# Patient Record
Sex: Female | Born: 1965 | Race: White | Hispanic: No | Smoking: Former smoker
Health system: Southern US, Community
[De-identification: ages and names within clinical notes are randomized; demographics above are authoritative.]

## PROBLEM LIST (undated history)

## (undated) DIAGNOSIS — Q232 Congenital mitral stenosis: Secondary | ICD-10-CM

## (undated) DIAGNOSIS — G35 Multiple sclerosis: Secondary | ICD-10-CM

## (undated) DIAGNOSIS — F419 Anxiety disorder, unspecified: Secondary | ICD-10-CM

## (undated) DIAGNOSIS — R42 Dizziness and giddiness: Secondary | ICD-10-CM

## (undated) DIAGNOSIS — R519 Headache, unspecified: Secondary | ICD-10-CM

## (undated) DIAGNOSIS — R2689 Other abnormalities of gait and mobility: Secondary | ICD-10-CM

## (undated) DIAGNOSIS — K589 Irritable bowel syndrome without diarrhea: Secondary | ICD-10-CM

## (undated) DIAGNOSIS — K5090 Crohn's disease, unspecified, without complications: Secondary | ICD-10-CM

## (undated) DIAGNOSIS — E11620 Type 2 diabetes mellitus with diabetic dermatitis: Secondary | ICD-10-CM

## (undated) DIAGNOSIS — K5792 Diverticulitis of intestine, part unspecified, without perforation or abscess without bleeding: Secondary | ICD-10-CM

## (undated) DIAGNOSIS — C801 Malignant (primary) neoplasm, unspecified: Secondary | ICD-10-CM

## (undated) DIAGNOSIS — H9313 Tinnitus, bilateral: Secondary | ICD-10-CM

## (undated) DIAGNOSIS — F1410 Cocaine abuse, uncomplicated: Secondary | ICD-10-CM

## (undated) DIAGNOSIS — K219 Gastro-esophageal reflux disease without esophagitis: Secondary | ICD-10-CM

## (undated) DIAGNOSIS — R51 Headache: Secondary | ICD-10-CM

## (undated) DIAGNOSIS — E7525 Metachromatic leukodystrophy: Secondary | ICD-10-CM

## (undated) HISTORY — PX: COLON SURGERY: SHX602

## (undated) HISTORY — PX: APPENDECTOMY: SHX54

## (undated) HISTORY — PX: KNEE ARTHROSCOPY: SUR90

## (undated) HISTORY — PX: ABDOMINAL HYSTERECTOMY: SHX81

---

## 1989-12-19 DIAGNOSIS — G35 Multiple sclerosis: Secondary | ICD-10-CM

## 1989-12-19 DIAGNOSIS — G35D Multiple sclerosis, unspecified: Secondary | ICD-10-CM

## 1989-12-19 HISTORY — DX: Multiple sclerosis, unspecified: G35.D

## 1989-12-19 HISTORY — DX: Multiple sclerosis: G35

## 2004-07-26 ENCOUNTER — Emergency Department (HOSPITAL_COMMUNITY): Admission: EM | Admit: 2004-07-26 | Discharge: 2004-07-26 | Payer: Self-pay | Admitting: Emergency Medicine

## 2004-08-29 ENCOUNTER — Inpatient Hospital Stay (HOSPITAL_COMMUNITY): Admission: EM | Admit: 2004-08-29 | Discharge: 2004-09-11 | Payer: Self-pay | Admitting: Emergency Medicine

## 2005-03-12 ENCOUNTER — Emergency Department (HOSPITAL_COMMUNITY): Admission: EM | Admit: 2005-03-12 | Discharge: 2005-03-12 | Payer: Self-pay | Admitting: Emergency Medicine

## 2005-09-19 IMAGING — CR DG CHEST 2V
2 series · 2 of 2 positions shown · non-contrast
Comparison: 09/02/04.

CLINICAL DATA: Chest pain and cough.  Smoking history.
 CHEST - 2 VIEWS:

[view not recorded (1 of 2)]
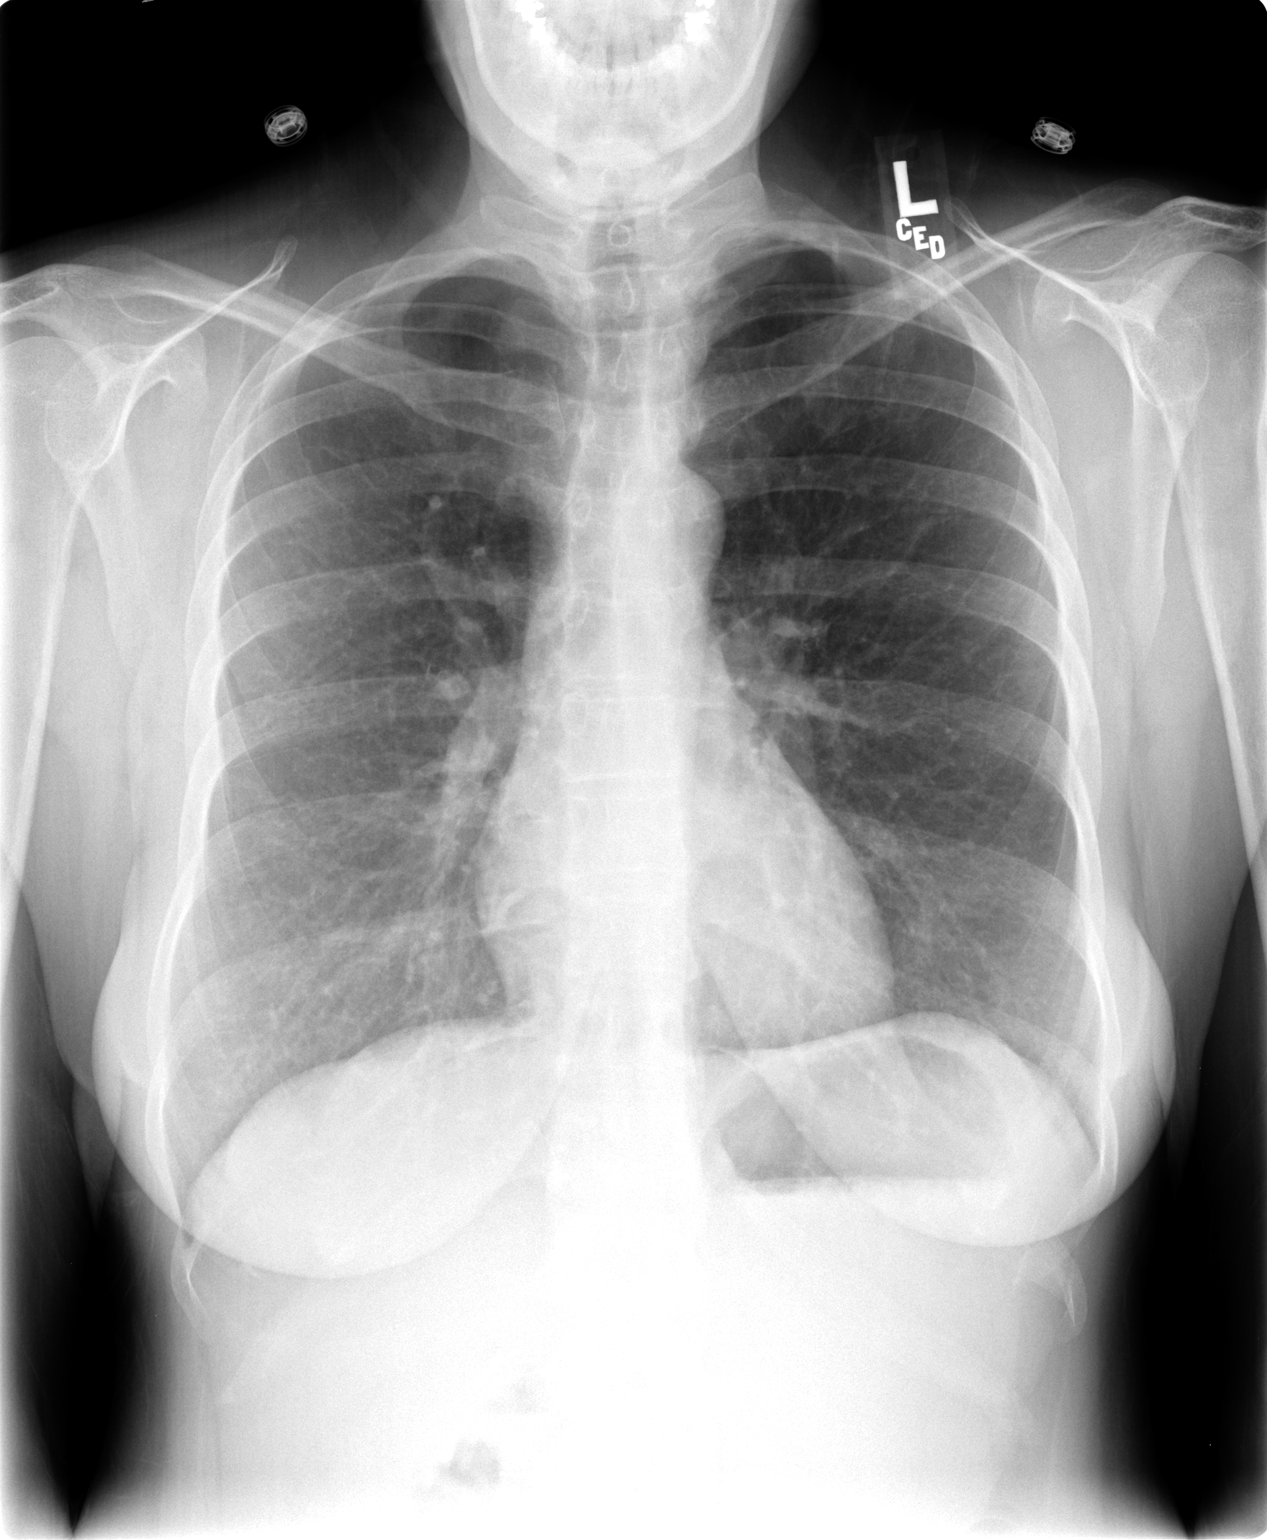

[view not recorded (2 of 2)]
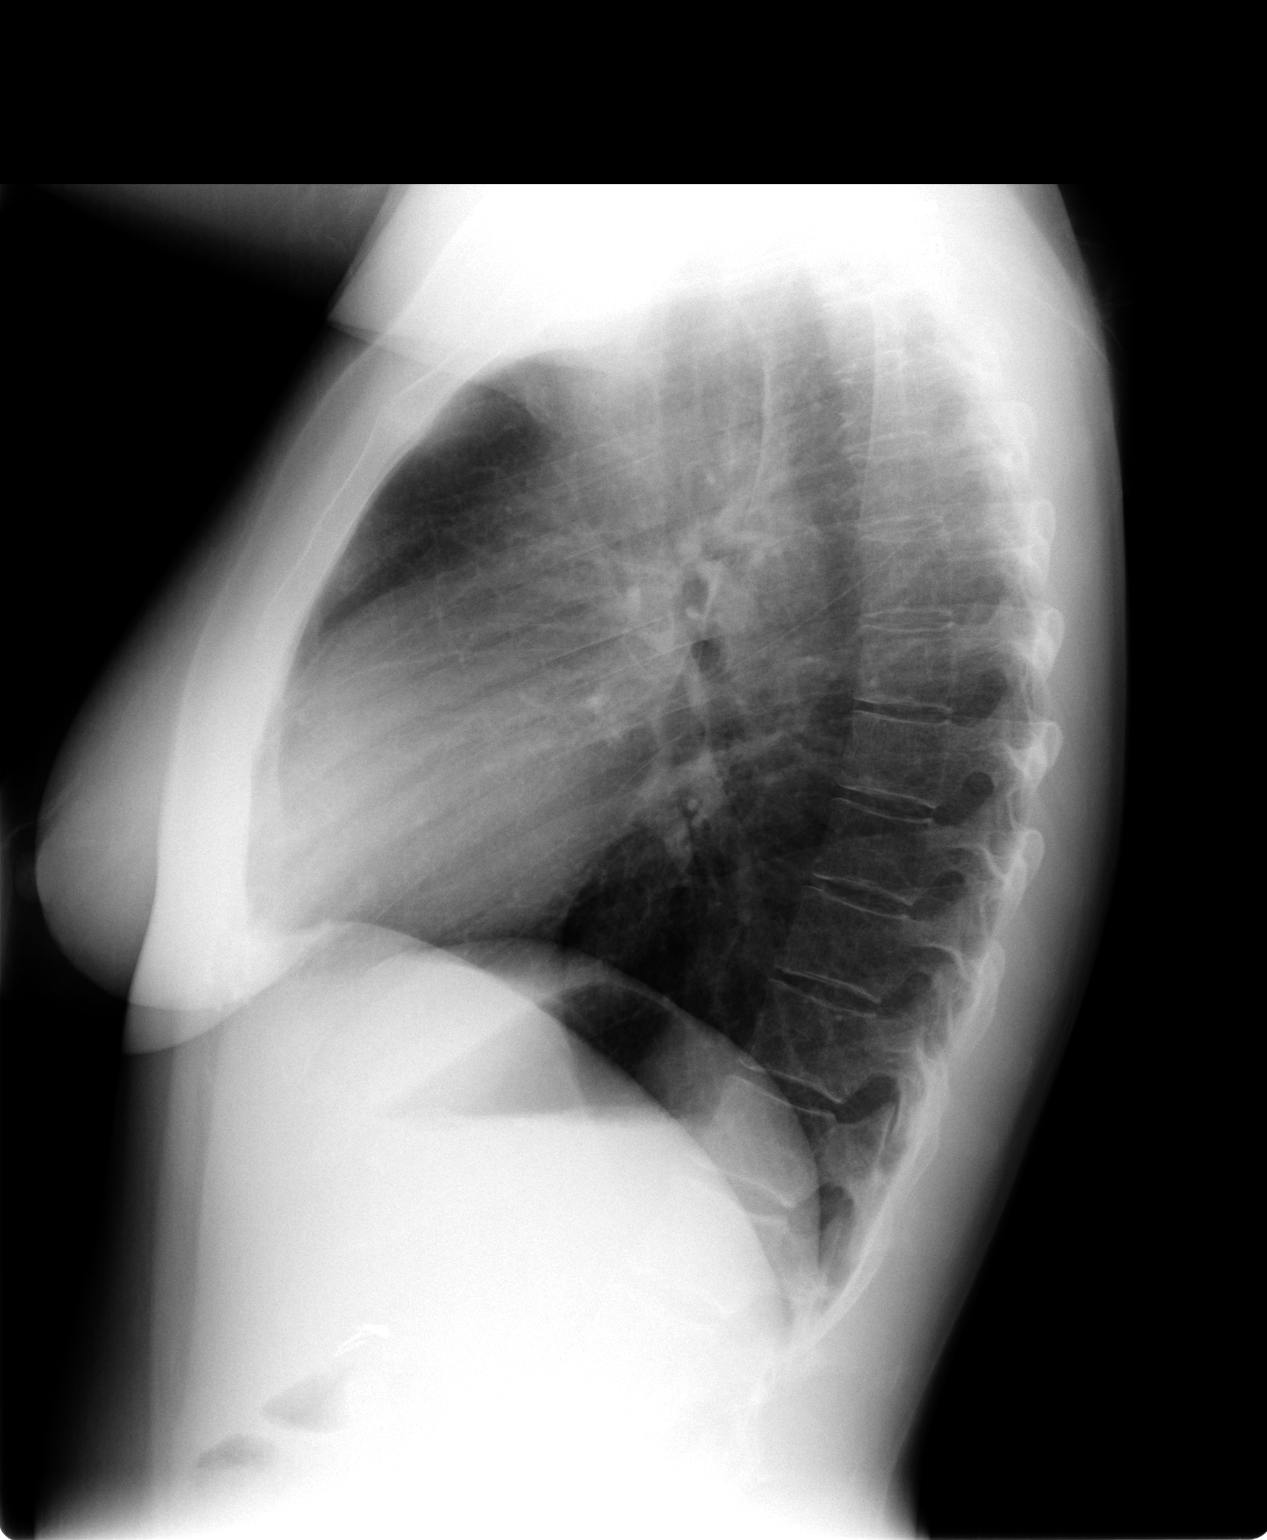

[2 of 2 positions shown; findings below may reference images not displayed]

The heart size is normal.   The mediastinum is unremarkable.  The lungs are clear.   No effusions.  No significant soft tissue or bony finding.
IMPRESSION: No active disease.

## 2018-01-06 ENCOUNTER — Emergency Department (HOSPITAL_COMMUNITY): Payer: Medicaid - Out of State

## 2018-01-06 ENCOUNTER — Other Ambulatory Visit: Payer: Self-pay

## 2018-01-06 ENCOUNTER — Emergency Department (HOSPITAL_COMMUNITY)
Admission: EM | Admit: 2018-01-06 | Discharge: 2018-01-06 | Disposition: A | Discharge status: Home / Self Care | Payer: Medicaid - Out of State | Attending: Emergency Medicine | Admitting: Emergency Medicine

## 2018-01-06 ENCOUNTER — Encounter (HOSPITAL_COMMUNITY): Payer: Self-pay | Admitting: Emergency Medicine

## 2018-01-06 DIAGNOSIS — Y999 Unspecified external cause status: Secondary | ICD-10-CM | POA: Diagnosis not present

## 2018-01-06 DIAGNOSIS — T1490XA Injury, unspecified, initial encounter: Secondary | ICD-10-CM

## 2018-01-06 DIAGNOSIS — W208XXA Other cause of strike by thrown, projected or falling object, initial encounter: Secondary | ICD-10-CM | POA: Insufficient documentation

## 2018-01-06 DIAGNOSIS — M25562 Pain in left knee: Secondary | ICD-10-CM | POA: Diagnosis not present

## 2018-01-06 DIAGNOSIS — F17210 Nicotine dependence, cigarettes, uncomplicated: Secondary | ICD-10-CM | POA: Insufficient documentation

## 2018-01-06 DIAGNOSIS — Z7982 Long term (current) use of aspirin: Secondary | ICD-10-CM | POA: Insufficient documentation

## 2018-01-06 DIAGNOSIS — Z91040 Latex allergy status: Secondary | ICD-10-CM | POA: Diagnosis not present

## 2018-01-06 DIAGNOSIS — S069X9A Unspecified intracranial injury with loss of consciousness of unspecified duration, initial encounter: Secondary | ICD-10-CM | POA: Insufficient documentation

## 2018-01-06 DIAGNOSIS — Y929 Unspecified place or not applicable: Secondary | ICD-10-CM | POA: Insufficient documentation

## 2018-01-06 DIAGNOSIS — S0990XA Unspecified injury of head, initial encounter: Secondary | ICD-10-CM | POA: Diagnosis present

## 2018-01-06 DIAGNOSIS — Y939 Activity, unspecified: Secondary | ICD-10-CM | POA: Insufficient documentation

## 2018-01-06 DIAGNOSIS — Z859 Personal history of malignant neoplasm, unspecified: Secondary | ICD-10-CM | POA: Insufficient documentation

## 2018-01-06 HISTORY — DX: Malignant (primary) neoplasm, unspecified: C80.1

## 2018-01-06 HISTORY — DX: Congenital mitral stenosis: Q23.2

## 2018-01-06 HISTORY — DX: Multiple sclerosis: G35

## 2018-01-06 HISTORY — DX: Diverticulitis of intestine, part unspecified, without perforation or abscess without bleeding: K57.92

## 2018-01-06 HISTORY — DX: Irritable bowel syndrome without diarrhea: K58.9

## 2018-01-06 HISTORY — DX: Irritable bowel syndrome, unspecified: K58.9

## 2018-01-06 HISTORY — DX: Metachromatic leukodystrophy: E75.25

## 2018-01-06 MED ORDER — KETOROLAC TROMETHAMINE 30 MG/ML IJ SOLN
30.0000 mg | Freq: Once | INTRAMUSCULAR | Status: AC
  Administered 2018-01-06: 30 mg via INTRAVENOUS
  Filled 2018-01-06: qty 1

## 2018-01-06 MED ORDER — PROMETHAZINE HCL 25 MG/ML IJ SOLN
25.0000 mg | Freq: Once | INTRAMUSCULAR | Status: AC
  Administered 2018-01-06: 25 mg via INTRAVENOUS
  Filled 2018-01-06: qty 1

## 2018-01-06 MED ORDER — IBUPROFEN 600 MG PO TABS: 600.0000 mg | ORAL_TABLET | Freq: Four times a day (QID) | ORAL | 0 refills | Status: AC | PRN

## 2018-01-06 MED ORDER — HYDROMORPHONE HCL 1 MG/ML IJ SOLN
0.5000 mg | Freq: Once | INTRAMUSCULAR | Status: AC
  Administered 2018-01-06: 0.5 mg via INTRAVENOUS
  Filled 2018-01-06: qty 1

## 2018-01-06 MED ORDER — OXYCODONE-ACETAMINOPHEN 5-325 MG PO TABS: 1.0000 | ORAL_TABLET | ORAL | 0 refills | Status: AC | PRN

## 2018-01-06 MED ORDER — ONDANSETRON HCL 4 MG/2ML IJ SOLN: 4.0000 mg | Freq: Once | INTRAMUSCULAR | Status: DC

## 2018-01-06 NOTE — ED Notes (Signed)
JI in to assess 

## 2018-01-06 NOTE — ED Notes (Signed)
To Rad 

## 2018-01-06 NOTE — ED Notes (Signed)
To radiology

## 2018-01-06 NOTE — Discharge Instructions (Signed)
Ice and elevate your knee as much as possible over the next several days. You may take the oxycodone prescribed for pain relief.  This will make you drowsy - do not drive within 4 hours of taking this medication and use sparingly.  Your xrays and CT scans are negative for acute bony injury or internal brain injury from todays event.  Refer to the head injury instructions and plan to see your doctor for a recheck in 1 week if your headache is not resolved.

## 2018-01-06 NOTE — ED Notes (Signed)
Awaiting eval  

## 2018-01-06 NOTE — ED Notes (Signed)
From rad 

## 2018-01-06 NOTE — ED Notes (Signed)
Call to Colome PA to request that she reassess pt and speak to her regarding her report if itching

## 2018-01-06 NOTE — ED Notes (Signed)
JI in to re assess 

## 2018-01-06 NOTE — ED Notes (Signed)
Pt on call light to complain that her pain meds are  Not working and that she is itching   Awaiting PA to reassess

## 2018-01-06 NOTE — ED Provider Notes (Signed)
[facility] EMERGENCY DEPARTMENT Provider Note   CSN: [id number] Arrival date & time: 01/06/18  1053     History   Chief Complaint Chief Complaint  Patient presents with  . Knee Pain    L knee    HPI Robin Freeman is a 52 y.o. female presenting with injury sustained when a large tree branch fell out of a tree hitting her on her head.  She states that she fell to the ground hitting her left knee which she states is unstable at baseline given multiple surgeries and complete loss of cartilage.  Additionally she had a brief LOC with the fall.  Her daughter-in-law who witnessed the event states she was unconscious for less than 60 seconds.  She denies any focal weakness, numbness, neck or back pain, confusion, but does endorse persistent headache.  She had 2 episodes of vomiting after she woke, and continues to be nauseated but no persistent emesis.  She was treated with fentanyl per EMS prior to arrival which she states did not improve her pain.   The history is provided by the patient.    Past Medical History:  Diagnosis Date  . Cancer ([place])   . Diverticulitis   . IBS (irritable bowel syndrome)   . MLD (metachromatic leukodystrophy)   . MS (congenital mitral stenosis)   . Multiple sclerosis ([place])     There are no active problems to display for this patient.   Past Surgical History:  Procedure Laterality Date  . ABDOMINAL HYSTERECTOMY    . APPENDECTOMY    . COLON SURGERY    . KNEE ARTHROSCOPY      OB History    No data available       Home Medications    Prior to Admission medications   Medication Sig Start Date End Date Taking? Authorizing Provider  aspirin EC 81 MG tablet Take 81 mg by mouth daily.   Yes [provider]  hydrOXYzine (ATARAX/VISTARIL) 25 MG tablet Take 25 mg by mouth 3 (three) times daily as needed.   Yes [provider]  Multiple Vitamin (MULTIVITAMIN) tablet Take 1 tablet by mouth daily.   Yes [provider]    omeprazole (PRILOSEC) 20 MG capsule Take 20 mg by mouth as needed.   Yes [provider]  promethazine (PHENERGAN) 25 MG tablet Take 25 mg by mouth as needed for nausea or vomiting.   Yes [provider]  ibuprofen (ADVIL,MOTRIN) 600 MG tablet Take 1 tablet (600 mg total) by mouth every 6 (six) hours as needed. 01/06/18   Evalee Jefferson, PA-C  oxyCODONE-acetaminophen (PERCOCET/ROXICET) 5-325 MG tablet Take 1 tablet by mouth every 4 (four) hours as needed. 01/06/18   Evalee Jefferson, PA-C    Family History No family history on file.  Social History Social History   Tobacco Use  . Smoking status: Current Every Day Smoker    Packs/day: 1.00    Types: Cigarettes  . Smokeless tobacco: Never Used  Substance Use Topics  . Alcohol use: No    Frequency: Never  . Drug use: Yes    Types: Marijuana     Allergies   Cephalosporins; Hydrocodone; Keppra [levetiracetam]; Lasix [furosemide]; Latex; Morphine and related; and Sulfa antibiotics   Review of Systems Review of Systems  Constitutional: Negative for fever.  Eyes: Negative for visual disturbance.  Gastrointestinal: Positive for nausea and vomiting. Negative for abdominal pain.  Musculoskeletal: Positive for arthralgias. Negative for back pain, joint swelling, myalgias and neck pain.  Skin: Negative.   Neurological: Positive for syncope and headaches. Negative for weakness and numbness.     Physical Exam Updated Vital Signs BP (!) 113/93   Pulse 67   Temp 98 F (36.7 C) (Oral)   Resp 16   Ht 5\' 2"  (1.575 m)   Wt 64.9 kg (143 lb)   SpO2 99%   BMI 26.16 kg/m   Physical Exam  Constitutional: She is oriented to person, place, and time. She appears well-developed and well-nourished.  HENT:  Head: Atraumatic.  Eyes: EOM are normal. Pupils are equal, round, and reactive to light.  Neck: Normal range of motion.  Cardiovascular: Normal rate.  Pulses:      Dorsalis pedis pulses are 2+ on the right side, and 2+ on  the left side.  Pulses equal bilaterally  Pulmonary/Chest: Effort normal and breath sounds normal.  Abdominal: Soft. There is no tenderness. There is no guarding.  Musculoskeletal: She exhibits tenderness.       Left knee: She exhibits no swelling and no effusion. Tenderness found. Lateral joint line tenderness noted.  Well-healed surgical incisions noted at left knee.  Neurological: She is alert and oriented to person, place, and time. She has normal strength. She displays normal reflexes. No cranial nerve deficit or sensory deficit.  Equal grip strength  Skin: Skin is warm and dry.  Skin intact without trauma.  She has some tenderness to palpation on her superior scalp, there is no hematoma or abrasions noted.  Psychiatric: She has a normal mood and affect.     ED Treatments / Results  Labs (all labs ordered are listed, but only abnormal results are displayed) Labs Reviewed - No data to display  EKG  EKG Interpretation None       Radiology Ct Head Wo Contrast  Result Date: 01/06/2018 CLINICAL DATA:  Post blunt trauma to the head with positive loss of consciousness. History of multiple sclerosis. EXAM: CT HEAD WITHOUT CONTRAST CT CERVICAL SPINE WITHOUT CONTRAST TECHNIQUE: Multidetector CT imaging of the head and cervical spine was performed following the standard protocol without intravenous contrast. Multiplanar CT image reconstructions of the cervical spine were also generated. COMPARISON:  Brain MRI 08/31/2004 FINDINGS: CT HEAD FINDINGS Brain: No evidence of acute infarction, hemorrhage, hydrocephalus, extra-axial collection or mass lesion/mass effect. Vascular: No hyperdense vessel or unexpected calcification. Skull: Normal. Negative for fracture or focal lesion. Sinuses/Orbits: No acute finding. Other: None. CT CERVICAL SPINE FINDINGS Alignment: Reversal of cervical lordosis. Skull base and vertebrae: No acute fracture. No primary bone lesion or focal pathologic process. Soft  tissues and spinal canal: No prevertebral fluid or swelling. No visible canal hematoma. Disc levels:  Mild multilevel osteoarthritic changes. Upper chest: Negative. Other: None. IMPRESSION: No acute intracranial abnormality. No evidence of acute traumatic injury to the cervical spine. Reversal of cervical lordosis, likely positional. Electronically Signed   By: Fidela Salisbury M.D.   On: 01/06/2018 12:55   Ct Cervical Spine Wo Contrast  Result Date: 01/06/2018 CLINICAL DATA:  Post blunt trauma to the head with positive loss of consciousness. History of multiple sclerosis. EXAM: CT HEAD WITHOUT CONTRAST CT CERVICAL SPINE WITHOUT CONTRAST TECHNIQUE: Multidetector CT imaging of the head and cervical spine was performed following the standard protocol without intravenous contrast. Multiplanar CT image reconstructions of the cervical spine were also generated. COMPARISON:  Brain MRI 08/31/2004 FINDINGS: CT HEAD FINDINGS Brain: No evidence of acute infarction, hemorrhage, hydrocephalus, extra-axial collection or mass lesion/mass effect. Vascular: No hyperdense vessel or unexpected calcification.  Skull: Normal. Negative for fracture or focal lesion. Sinuses/Orbits: No acute finding. Other: None. CT CERVICAL SPINE FINDINGS Alignment: Reversal of cervical lordosis. Skull base and vertebrae: No acute fracture. No primary bone lesion or focal pathologic process. Soft tissues and spinal canal: No prevertebral fluid or swelling. No visible canal hematoma. Disc levels:  Mild multilevel osteoarthritic changes. Upper chest: Negative. Other: None. IMPRESSION: No acute intracranial abnormality. No evidence of acute traumatic injury to the cervical spine. Reversal of cervical lordosis, likely positional. Electronically Signed   By: Fidela Salisbury M.D.   On: 01/06/2018 12:55   Dg Knee Complete 4 Views Left  Result Date: 01/06/2018 CLINICAL DATA:  Pain following fall EXAM: LEFT KNEE - COMPLETE 4+ VIEW COMPARISON:  None.  FINDINGS: Frontal, lateral, and bilateral oblique views were obtained. There is postoperative change with evidence of cruciate ligament fixation. No acute fracture or dislocation. No joint effusion. There is mild joint space narrowing medially with spurring in this area. Other joint spaces appear unremarkable. No erosive change. IMPRESSION: Narrowing and spurring medially. Other joint spaces appear normal. No fracture or joint effusion. Postoperative change noted. Electronically Signed   By: Lowella Grip III M.D.   On: 01/06/2018 11:42    Procedures Procedures (including critical care time)  Medications Ordered in ED Medications  HYDROmorphone (DILAUDID) injection 0.5 mg (0.5 mg Intravenous Given 01/06/18 1204)  promethazine (PHENERGAN) injection 25 mg (25 mg Intravenous Given 01/06/18 1201)  ketorolac (TORADOL) 30 MG/ML injection 30 mg (30 mg Intravenous Given 01/06/18 1325)     Initial Impression / Assessment and Plan / ED Course  I have reviewed the triage vital signs and the nursing notes.  Pertinent labs & imaging results that were available during my care of the patient were reviewed by me and considered in my medical decision making (see chart for details).     Pt with acute on chronic left knee pain without obvious bony deformity on today's imaging.  She endorses that she has chronically has no support in the left knee and it frequently dislocates if she is not careful with ambulation.  She has been advised by her orthopedist in [place] that she needs a total knee replacement which she has been avoiding.  She has persistent headache at time of discharge but improving.  She has had no vomiting while in the department.  CT imaging reviewed and negative for acute intracranial trauma.  She was given minor head injury instructions and advised follow-up with her PCP for recheck in 1 week if her symptoms persist.  She was also advised to follow-up with her orthopedist as needed for ongoing  management of her chronic left knee pain.  She was placed in a knee immobilizer to stabilize the knee.  She was prescribed ibuprofen and oxycodone for pain relief.  The [place] and [place] narcotic database was reviewed prior to disposition.  Final Clinical Impressions(s) / ED Diagnoses   Final diagnoses:  Acute pain of left knee  Minor head injury with loss of consciousness, initial encounter [facility])    ED Discharge Orders        Ordered    oxyCODONE-acetaminophen (PERCOCET/ROXICET) 5-325 MG tablet  Every 4 hours PRN     01/06/18 1322    ibuprofen (ADVIL,MOTRIN) 600 MG tablet  Every 6 hours PRN     01/06/18 1322       Evalee Jefferson, PA-C 01/06/18 1432    Mesner, Corene Cornea, MD 01/06/18 1520

## 2018-01-06 NOTE — ED Notes (Signed)
Pt reports she has pulled off her leads etc due to her allergy to latex  CN in to educated pt that there is no latex on anything touching pt

## 2018-01-06 NOTE — ED Notes (Addendum)
Family in room to visit- pt continues to be alert

## 2018-01-06 NOTE — ED Triage Notes (Signed)
Walking dog Branch fell off of tree and hit in head  LOC Also complains of  L knee pain-chronic per pt and EMS report  PCP in [employer]

## 2018-01-06 NOTE — ED Notes (Signed)
PT HAS RECEIVED FENT 100 MCG ENROUTE BY EMS  REPORTS PAIN 10/10 IN SPITE OF RELAXED FACIAL FEATURES, FROM EXCEPT L KNEE

## 2018-12-02 ENCOUNTER — Emergency Department (HOSPITAL_COMMUNITY): Payer: Medicaid - Out of State

## 2018-12-02 ENCOUNTER — Other Ambulatory Visit: Payer: Self-pay

## 2018-12-02 ENCOUNTER — Emergency Department (HOSPITAL_COMMUNITY)
Admission: EM | Admit: 2018-12-02 | Discharge: 2018-12-02 | Disposition: A | Discharge status: Home / Self Care | Payer: Medicaid - Out of State | Attending: Emergency Medicine | Admitting: Emergency Medicine

## 2018-12-02 ENCOUNTER — Encounter (HOSPITAL_COMMUNITY): Payer: Self-pay

## 2018-12-02 DIAGNOSIS — Z79899 Other long term (current) drug therapy: Secondary | ICD-10-CM | POA: Insufficient documentation

## 2018-12-02 DIAGNOSIS — Z7982 Long term (current) use of aspirin: Secondary | ICD-10-CM | POA: Insufficient documentation

## 2018-12-02 DIAGNOSIS — R112 Nausea with vomiting, unspecified: Secondary | ICD-10-CM | POA: Diagnosis not present

## 2018-12-02 DIAGNOSIS — R1084 Generalized abdominal pain: Secondary | ICD-10-CM | POA: Insufficient documentation

## 2018-12-02 DIAGNOSIS — F17210 Nicotine dependence, cigarettes, uncomplicated: Secondary | ICD-10-CM | POA: Insufficient documentation

## 2018-12-02 HISTORY — DX: Other abnormalities of gait and mobility: R26.89

## 2018-12-02 HISTORY — DX: Anxiety disorder, unspecified: F41.9

## 2018-12-02 HISTORY — DX: Gastro-esophageal reflux disease without esophagitis: K21.9

## 2018-12-02 HISTORY — DX: Dizziness and giddiness: R42

## 2018-12-02 HISTORY — DX: Crohn's disease, unspecified, without complications: K50.90

## 2018-12-02 HISTORY — DX: Cocaine abuse, uncomplicated: F14.10

## 2018-12-02 HISTORY — DX: Headache, unspecified: R51.9

## 2018-12-02 HISTORY — DX: Tinnitus, bilateral: H93.13

## 2018-12-02 HISTORY — DX: Type 2 diabetes mellitus with diabetic dermatitis: E11.620

## 2018-12-02 HISTORY — DX: Headache: R51

## 2018-12-02 LAB — URINALYSIS, ROUTINE W REFLEX MICROSCOPIC
Bilirubin Urine: NEGATIVE
Glucose, UA: NEGATIVE mg/dL
Hgb urine dipstick: NEGATIVE
Ketones, ur: NEGATIVE mg/dL
Leukocytes, UA: NEGATIVE
Nitrite: NEGATIVE
Protein, ur: NEGATIVE mg/dL
Specific Gravity, Urine: >1.046 — ABNORMAL HIGH (ref 1.005–1.030)
pH: 7 (ref 5.0–8.0)

## 2018-12-02 LAB — COMPREHENSIVE METABOLIC PANEL
ALT: 18 U/L (ref 0–44)
AST: 18 U/L (ref 15–41)
Albumin: 3.7 g/dL (ref 3.5–5.0)
Alkaline Phosphatase: 96 U/L (ref 38–126)
Anion gap: 6 (ref 5–15)
BILIRUBIN TOTAL: 0.7 mg/dL (ref 0.3–1.2)
BUN: 17 mg/dL (ref 6–20)
CO2: 23 mmol/L (ref 22–32)
Calcium: 8.8 mg/dL — ABNORMAL LOW (ref 8.9–10.3)
Chloride: 107 mmol/L (ref 98–111)
Creatinine, Ser: 0.6 mg/dL (ref 0.44–1.00)
GFR calc Af Amer: >60 mL/min (ref >60)
GFR calc non Af Amer: >60 mL/min (ref >60)
Glucose, Bld: 89 mg/dL (ref 70–99)
Potassium: 4 mmol/L (ref 3.5–5.1)
Sodium: 136 mmol/L (ref 135–145)
TOTAL PROTEIN: 6.9 g/dL (ref 6.5–8.1)

## 2018-12-02 LAB — RAPID URINE DRUG SCREEN, HOSP PERFORMED
AMPHETAMINES: NOT DETECTED
Barbiturates: NOT DETECTED
Benzodiazepines: NOT DETECTED
Cocaine: NOT DETECTED
Opiates: NOT DETECTED
Tetrahydrocannabinol: POSITIVE — AB

## 2018-12-02 LAB — CBC
HCT: 42 % (ref 36.0–46.0)
Hemoglobin: 13.7 g/dL (ref 12.0–15.0)
MCH: 30.4 pg (ref 26.0–34.0)
MCHC: 32.6 g/dL (ref 30.0–36.0)
MCV: 93.3 fL (ref 80.0–100.0)
Platelets: 232 10*3/uL (ref 150–400)
RBC: 4.5 MIL/uL (ref 3.87–5.11)
RDW: 13 % (ref 11.5–15.5)
WBC: 10.4 10*3/uL (ref 4.0–10.5)
nRBC: 0 % (ref 0.0–0.2)

## 2018-12-02 LAB — TROPONIN I: Troponin I: <0.03 ng/mL (ref <0.03)

## 2018-12-02 LAB — LIPASE, BLOOD: Lipase: 35 U/L (ref 11–51)

## 2018-12-02 MED ORDER — DICYCLOMINE HCL 10 MG/ML IM SOLN
20.0000 mg | Freq: Once | INTRAMUSCULAR | Status: AC
  Administered 2018-12-02: 20 mg via INTRAMUSCULAR
  Filled 2018-12-02: qty 2

## 2018-12-02 MED ORDER — DICYCLOMINE HCL 20 MG PO TABS: 20.0000 mg | ORAL_TABLET | Freq: Four times a day (QID) | ORAL | 0 refills | Status: AC | PRN

## 2018-12-02 MED ORDER — METOCLOPRAMIDE HCL 5 MG/ML IJ SOLN
10.0000 mg | Freq: Once | INTRAMUSCULAR | Status: AC
  Administered 2018-12-02: 10 mg via INTRAVENOUS
  Filled 2018-12-02: qty 2

## 2018-12-02 MED ORDER — FAMOTIDINE IN NACL 20-0.9 MG/50ML-% IV SOLN
20.0000 mg | Freq: Once | INTRAVENOUS | Status: AC
  Administered 2018-12-02: 20 mg via INTRAVENOUS
  Filled 2018-12-02: qty 50

## 2018-12-02 MED ORDER — SODIUM CHLORIDE 0.9 % IV BOLUS
1000.0000 mL | Freq: Once | INTRAVENOUS | Status: AC
  Administered 2018-12-02: 1000 mL via INTRAVENOUS

## 2018-12-02 MED ORDER — DIPHENHYDRAMINE HCL 50 MG/ML IJ SOLN
50.0000 mg | Freq: Once | INTRAMUSCULAR | Status: AC
  Administered 2018-12-02: 50 mg via INTRAVENOUS
  Filled 2018-12-02: qty 1

## 2018-12-02 MED ORDER — IOPAMIDOL (ISOVUE-300) INJECTION 61%
100.0000 mL | Freq: Once | INTRAVENOUS | Status: AC | PRN
  Administered 2018-12-02: 100 mL via INTRAVENOUS

## 2018-12-02 MED ORDER — METOCLOPRAMIDE HCL 10 MG PO TABS: 10.0000 mg | ORAL_TABLET | Freq: Four times a day (QID) | ORAL | 0 refills | Status: AC | PRN

## 2018-12-02 NOTE — ED Notes (Signed)
Pt given crackers and gingerale.

## 2018-12-02 NOTE — ED Provider Notes (Signed)
[facility] EMERGENCY DEPARTMENT Provider Note   CSN: [id number] Arrival date & time: 12/02/18  1135     History   Chief Complaint Chief Complaint  Patient presents with  . Emesis    HPI Robin Freeman is a 52 y.o. female.  HPI  Pt was seen at 1315.  Per pt, c/o gradual onset and persistence of multiple intermittent episodes of N/V for the past 2 weeks.   Describes her stools per her usual "chronic diarrhea;" denies any change. Has been associated with generalized abd "cramping" and "fullness," especially after she eats. Pt states she has been taking zofran and phenergan without improvement.  Denies CP/SOB, no back pain, no fevers, no black or blood in stools or emesis.    Past Medical History:  Diagnosis Date  . Anxiety   . Cancer ([place])   . Cocaine abuse ([place])   . Crohn disease ([place])   . Diverticulitis   . GERD (gastroesophageal reflux disease)   . Headache disorder   . IBS (irritable bowel syndrome)   . Imbalance    chronic  . 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 MS (congenital mitral stenosis)   . Multiple sclerosis ([place]) 1991  . 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 Vertigo     There are no active problems to display for this patient.   Past Surgical History:  Procedure Laterality Date  . ABDOMINAL HYSTERECTOMY    . APPENDECTOMY    . COLON SURGERY    . KNEE ARTHROSCOPY       OB History   No obstetric history on file.      Home Medications    Prior to Admission medications   Medication Sig Start Date End Date Taking? Authorizing Provider  aspirin EC 81 MG tablet Take 81 mg by mouth daily.   Yes [provider]  hydrOXYzine (ATARAX/VISTARIL) 25 MG tablet Take 25 mg by mouth 3 (three) times daily as needed.   Yes [provider]  ibuprofen (ADVIL,MOTRIN) 600 MG tablet Take 1 tablet (600 mg total) by mouth every 6 (six) hours as needed. 01/06/18   Yes Idol, Almyra Free, PA-C  meclizine (ANTIVERT) 25 MG tablet Take 25 mg by mouth 3 (three) times daily as needed for dizziness.   Yes [provider]  Multiple Vitamin (MULTIVITAMIN) tablet Take 1 tablet by mouth daily.   Yes [provider]  oxyCODONE-acetaminophen (PERCOCET/ROXICET) 5-325 MG tablet Take 1 tablet by mouth every 4 (four) hours as needed. 01/06/18  Yes Idol, Almyra Free, PA-C  pantoprazole (PROTONIX) 20 MG tablet Take by mouth. 10/11/18  Yes [provider]  promethazine (PHENERGAN) 25 MG tablet Take 25 mg by mouth as needed for nausea or vomiting.   Yes [provider]  omeprazole (PRILOSEC) 20 MG capsule Take 20 mg by mouth as needed.    [provider]    Family History No family history on file.  Social History Social History   Tobacco Use  . Smoking status: Current Every Day Smoker    Packs/day: 1.00    Types: Cigarettes  . Smokeless tobacco: Never Used  Substance Use Topics  . Alcohol use: No    Frequency: Never  . Drug use: Yes    Types: Marijuana     Allergies   Cephalosporins; Doxycycline; Hydrocodone; Keppra [levetiracetam]; Lasix [furosemide]; Latex; Morphine and related; and Sulfa antibiotics   Review of  Systems Review of Systems ROS: Statement: All systems negative except as marked or noted in the HPI; Constitutional: Negative for fever and chills. ; ; Eyes: Negative for eye pain, redness and discharge. ; ; ENMT: Negative for ear pain, hoarseness, nasal congestion, sinus pressure and sore throat. ; ; Cardiovascular: Negative for chest pain, palpitations, diaphoresis, dyspnea and peripheral edema. ; ; Respiratory: Negative for cough, wheezing and stridor. ; ; Gastrointestinal: +N/V, abd pain. Negative for diarrhea, blood in stool, hematemesis, jaundice and rectal bleeding. . ; ; Genitourinary: Negative for dysuria, flank pain and hematuria. ; ; Musculoskeletal: Negative for back pain and neck pain. Negative for swelling  and trauma.; ; Skin: Negative for pruritus, rash, abrasions, blisters, bruising and skin lesion.; ; Neuro: Negative for headache, lightheadedness and neck stiffness. Negative for weakness, altered level of consciousness, altered mental status, extremity weakness, paresthesias, involuntary movement, seizure and syncope.       Physical Exam Updated Vital Signs BP (!) 147/91 (BP Location: Right Arm)   Pulse 87   Temp 98.2 F (36.8 C) (Oral)   Resp 15   Ht 5' 1"  (1.549 m)   Wt 64.9 kg   SpO2 98%   BMI 27.02 kg/m    BP 112/77 (BP Location: Right Arm)   Pulse 76   Temp 98.2 F (36.8 C) (Oral)   Resp 19   Ht 5' 1"  (1.549 m)   Wt 64.9 kg   SpO2 99%   BMI 27.02 kg/m    Physical Exam 1320: Physical examination:  Nursing notes reviewed; Vital signs and O2 SAT reviewed;  Constitutional: Well developed, Well nourished, Well hydrated, In no acute distress; Head:  Normocephalic, atraumatic; Eyes: EOMI, PERRL, No scleral icterus; ENMT: TM's clear bilat. Mouth and pharynx normal, Mucous membranes moist; Neck: Supple, Full range of motion, No lymphadenopathy; Cardiovascular: Regular rate and rhythm, No gallop; Respiratory: Breath sounds clear & equal bilaterally, No wheezes.  Speaking full sentences with ease, Normal respiratory effort/excursion; Chest: Nontender, Movement normal; Abdomen: Soft, +mild generalized tenderness to palp. No rebound or guarding. Nondistended, Normal bowel sounds; Genitourinary: No CVA tenderness; Extremities: Peripheral pulses normal, No tenderness, No edema, No calf edema or asymmetry.; Neuro: AA&Ox3, Major CN grossly intact.  Speech clear. No gross focal motor or sensory deficits in extremities.; Skin: Color normal, Warm, Dry.   ED Treatments / Results  Labs (all labs ordered are listed, but only abnormal results are displayed)   EKG EKG Interpretation  Date/Time:  Sunday December 02 2018 14:53:04 EST Ventricular Rate:  74 PR Interval:    QRS Duration: 90 QT  Interval:  378 QTC Calculation: 420 R Axis:   83 Text Interpretation:  Sinus rhythm Borderline repolarization abnormality Baseline wander When compared with ECG of 08/28/2004 No significant change was found Confirmed by Francine Graven [phone]) on 12/02/2018 3:04:35 PM   Radiology   Procedures Procedures (including critical care time)  Medications Ordered in ED Medications  sodium chloride 0.9 % bolus 1,000 mL (has no administration in time range)  metoCLOPramide (REGLAN) injection 10 mg (has no administration in time range)  famotidine (PEPCID) IVPB 20 mg premix (has no administration in time range)     Initial Impression / Assessment and Plan / ED Course  I have reviewed the triage vital signs and the nursing notes.  Pertinent labs & imaging results that were available during my care of the patient were reviewed by me and considered in my medical decision making (see chart for details).  MDM Reviewed: previous  chart, nursing note and vitals Reviewed previous: labs and ECG Interpretation: labs, ECG, x-ray and CT scan    Results for orders placed or performed during the hospital encounter of 12/02/18  Lipase, blood  Result Value Ref Range   Lipase 35 11 - 51 U/L  Comprehensive metabolic panel  Result Value Ref Range   Sodium 136 135 - 145 mmol/L   Potassium 4.0 3.5 - 5.1 mmol/L   Chloride 107 98 - 111 mmol/L   CO2 23 22 - 32 mmol/L   Glucose, Bld 89 70 - 99 mg/dL   BUN 17 6 - 20 mg/dL   Creatinine, Ser 0.60 0.44 - 1.00 mg/dL   Calcium 8.8 (L) 8.9 - 10.3 mg/dL   Total Protein 6.9 6.5 - 8.1 g/dL   Albumin 3.7 3.5 - 5.0 g/dL   AST 18 15 - 41 U/L   ALT 18 0 - 44 U/L   Alkaline Phosphatase 96 38 - 126 U/L   Total Bilirubin 0.7 0.3 - 1.2 mg/dL   GFR calc non Af Amer >60 >60 mL/min   GFR calc Af Amer >60 >60 mL/min   Anion gap 6 5 - 15  CBC  Result Value Ref Range   WBC 10.4 4.0 - 10.5 K/uL   RBC 4.50 3.87 - 5.11 MIL/uL   Hemoglobin 13.7 12.0 - 15.0 g/dL   HCT  42.0 36.0 - 46.0 %   MCV 93.3 80.0 - 100.0 fL   MCH 30.4 26.0 - 34.0 pg   MCHC 32.6 30.0 - 36.0 g/dL   RDW 13.0 11.5 - 15.5 %   Platelets 232 150 - 400 K/uL   nRBC 0.0 0.0 - 0.2 %  Urinalysis, Routine w reflex microscopic  Result Value Ref Range   Color, Urine YELLOW YELLOW   APPearance CLEAR CLEAR   Specific Gravity, Urine >1.046 (H) 1.005 - 1.030   pH 7.0 5.0 - 8.0   Glucose, UA NEGATIVE NEGATIVE mg/dL   Hgb urine dipstick NEGATIVE NEGATIVE   Bilirubin Urine NEGATIVE NEGATIVE   Ketones, ur NEGATIVE NEGATIVE mg/dL   Protein, ur NEGATIVE NEGATIVE mg/dL   Nitrite NEGATIVE NEGATIVE   Leukocytes, UA NEGATIVE NEGATIVE  Troponin I - Once  Result Value Ref Range   Troponin I <0.03 <0.03 ng/mL  Urine rapid drug screen (hosp performed)  Result Value Ref Range   Opiates NONE DETECTED NONE DETECTED   Cocaine NONE DETECTED NONE DETECTED   Benzodiazepines NONE DETECTED NONE DETECTED   Amphetamines NONE DETECTED NONE DETECTED   Tetrahydrocannabinol POSITIVE (A) NONE DETECTED   Barbiturates NONE DETECTED NONE DETECTED   Dg Chest 2 View Result Date: 12/02/2018 CLINICAL DATA:  Postprandial vomiting EXAM: CHEST - 2 VIEW COMPARISON:  03/12/2005; 09/02/2004 FINDINGS: Grossly unchanged cardiac silhouette and mediastinal contours. The lungs remain hyperexpanded with flattening the diaphragms and diffuse slightly nodular thickening of the pulmonary interstitium. No discrete focal airspace opacities. No pleural effusion or pneumothorax. No evidence of edema. No acute osseous abnormalities. Post cholecystectomy. IMPRESSION: Findings suggestive of airways disease / bronchitis. No focal airspace opacities to suggest pneumonia. Electronically Signed   By: Sandi Mariscal M.D.   On: 12/02/2018 14:40   Ct Abdomen Pelvis W Contrast Result Date: 12/02/2018 CLINICAL DATA:  Postprandial vomiting for the past 2 weeks. EXAM: CT ABDOMEN AND PELVIS WITH CONTRAST TECHNIQUE: Multidetector CT imaging of the abdomen and  pelvis was performed using the standard protocol following bolus administration of intravenous contrast. CONTRAST:  140m ISOVUE-300 IOPAMIDOL (ISOVUE-300) INJECTION 61% COMPARISON:  Nuclear medicine gastric emptying study-09/08/2004 FINDINGS: Lower chest: Limited visualization of the lower thorax is degraded secondary to patient respiratory artifact. There is minimal dependent subpleural ground-glass opacities favored to represent atelectasis. No discrete focal airspace opacities. No pleural effusion Normal heart size.  No pericardial effusion. Hepatobiliary: Normal hepatic contour. No discrete hepatic lesions. Post cholecystectomy. Common bile duct is mildly dilated measuring 8 mm in diameter (coronal image 33, series 5), with associated mild centralized intrahepatic biliary duct dilatation likely the sequela of post cholecystectomy state and biliary reservoir phenomena. No ascites. Pancreas: Normal appearance of the pancreas Spleen: Normal appearance of the spleen. Note is made of a small splenule. Adrenals/Urinary Tract: There is symmetric enhancement and excretion of the bilateral kidneys. Punctate (approximately 0.9 cm) hypoattenuating right-sided renal lesion (image 17, series 5), is too small to accurately characterize though favored to represent a renal cyst. No discrete left-sided renal lesions. Normal appearance the bilateral adrenal glands. Normal appearance of the urinary bladder given degree distention. Stomach/Bowel: Scattered colonic diverticulosis without evidence of superimposed acute diverticulitis. Moderate colonic stool burden without evidence of enteric obstruction. The bowel is normal in course and caliber without wall thickening or evidence of enteric obstruction. Normal appearance of the terminal ileum. The appendix is not visualized compatible with provided operative history. No pneumoperitoneum, pneumatosis or portal venous gas. Vascular/Lymphatic: Moderate amount of mixed calcified and  noncalcified atherosclerotic plaque within a normal caliber abdominal aorta, not resulting in hemodynamically significant stenosis. The major branch vessels of the abdominal aorta appear patent on this non CTA examination. Incidental note is made of a circumaortic left renal vein. Scattered retroperitoneal lymph nodes are numerous though individually not enlarged by size criteria. No bulky retroperitoneal, mesenteric, pelvic or inguinal lymphadenopathy. Reproductive: Post hysterectomy. No discrete adnexal lesion. No free fluid in the pelvic cul-de-sac. Other: Regional soft tissues appear normal. Musculoskeletal: No acute or aggressive osseous abnormalities. IMPRESSION: 1. No explanation for patient's postprandial abdominal pain. Specifically, no evidence of enteric or urinary obstruction. Note, patient did have an abnormal gastric emptying study in 2005. Clinical correlation is advised. 2. Post hysterectomy, cholecystectomy and appendectomy. 3.  Aortic Atherosclerosis (ICD10-I70.0). Electronically Signed   By: Sandi Mariscal M.D.   On: 12/02/2018 14:39    1555:  Pt has tol PO well while in the ED without N/V.  No stooling while in the ED.  Abd benign, resps easy, VSS. Feels better and wants to go home now. Tx symptomatically, f/u GI MD and PMD. Dx and testing d/w pt.  Questions answered.  Verb understanding, agreeable to d/c home with outpt f/u.      Final Clinical Impressions(s) / ED Diagnoses   Final diagnoses:  None    ED Discharge Orders    None       Francine Graven, DO 12/06/18 2154

## 2018-12-02 NOTE — ED Triage Notes (Addendum)
Pt reports that every time she eats or drinks she vomits X 2 weeks. Pt reports abdominal cramping. Pt reports she has been taking phenergan and it has not been helping. Pt also noted to have a congested cough. Also being seen for chronic HA

## 2018-12-02 NOTE — Discharge Instructions (Signed)
Take the prescriptions as directed.  Increase your fluid intake (ie:  Gatoraide) for the next few days.  Eat a bland diet and advance to your regular diet slowly as you can tolerate it.   Avoid full strength juices, as well as milk and milk products until your diarrhea has improved.   Call your regular medical doctor and your GI doctor tomorrow to schedule a follow up appointment this week.  Return to the Emergency Department immediately sooner if worsening.

## 2019-10-08 ENCOUNTER — Encounter (HOSPITAL_COMMUNITY): Payer: Self-pay | Admitting: *Deleted

## 2019-10-08 ENCOUNTER — Emergency Department (HOSPITAL_COMMUNITY): Payer: Medicaid - Out of State

## 2019-10-08 ENCOUNTER — Emergency Department (HOSPITAL_COMMUNITY)
Admission: EM | Admit: 2019-10-08 | Discharge: 2019-10-08 | Disposition: A | Discharge status: Home / Self Care | Payer: Medicaid - Out of State | Attending: Emergency Medicine | Admitting: Emergency Medicine

## 2019-10-08 ENCOUNTER — Other Ambulatory Visit: Payer: Self-pay

## 2019-10-08 DIAGNOSIS — Z7982 Long term (current) use of aspirin: Secondary | ICD-10-CM | POA: Insufficient documentation

## 2019-10-08 DIAGNOSIS — Z20828 Contact with and (suspected) exposure to other viral communicable diseases: Secondary | ICD-10-CM | POA: Diagnosis not present

## 2019-10-08 DIAGNOSIS — J209 Acute bronchitis, unspecified: Secondary | ICD-10-CM | POA: Diagnosis not present

## 2019-10-08 DIAGNOSIS — Z91040 Latex allergy status: Secondary | ICD-10-CM | POA: Diagnosis not present

## 2019-10-08 DIAGNOSIS — Z87891 Personal history of nicotine dependence: Secondary | ICD-10-CM | POA: Insufficient documentation

## 2019-10-08 DIAGNOSIS — R05 Cough: Secondary | ICD-10-CM | POA: Diagnosis present

## 2019-10-08 DIAGNOSIS — Z79899 Other long term (current) drug therapy: Secondary | ICD-10-CM | POA: Diagnosis not present

## 2019-10-08 LAB — SARS CORONAVIRUS 2 (TAT 6-24 HRS): SARS Coronavirus 2: NEGATIVE

## 2019-10-08 MED ORDER — PREDNISONE 20 MG PO TABS
40.0000 mg | ORAL_TABLET | Freq: Once | ORAL | Status: AC
  Administered 2019-10-08: 40 mg via ORAL
  Filled 2019-10-08: qty 2

## 2019-10-08 MED ORDER — BENZONATATE 100 MG PO CAPS: 100.0000 mg | ORAL_CAPSULE | Freq: Three times a day (TID) | ORAL | 0 refills | Status: AC

## 2019-10-08 MED ORDER — BENZONATATE 100 MG PO CAPS
200.0000 mg | ORAL_CAPSULE | Freq: Once | ORAL | Status: AC
  Administered 2019-10-08: 200 mg via ORAL
  Filled 2019-10-08: qty 2

## 2019-10-08 MED ORDER — PREDNISONE 20 MG PO TABS: 40.0000 mg | ORAL_TABLET | Freq: Every day | ORAL | 0 refills | Status: AC

## 2019-10-08 MED ORDER — ALBUTEROL SULFATE HFA 108 (90 BASE) MCG/ACT IN AERS
2.0000 | INHALATION_SPRAY | RESPIRATORY_TRACT | Status: AC
  Administered 2019-10-08: 12:00:00 2 via RESPIRATORY_TRACT
  Filled 2019-10-08: qty 6.7

## 2019-10-08 MED ORDER — AEROCHAMBER Z-STAT PLUS/MEDIUM MISC
1.0000 | Freq: Once | Status: AC
  Administered 2019-10-08: 12:00:00 1
  Filled 2019-10-08: qty 1

## 2019-10-08 NOTE — ED Triage Notes (Signed)
Patient presents to the ED for productive cough, congestion for 8 days.  Patient has just completed antibiotics today.  Patient feels symptoms are worsening.  Patient does not report fever.

## 2019-10-08 NOTE — ED Provider Notes (Signed)
[facility] EMERGENCY DEPARTMENT Provider Note   CSN: [id number] Arrival date & time: 10/08/19  1108     History   Chief Complaint Chief Complaint  Patient presents with   Cough    HPI Robin Freeman is a 53 y.o. female.     HPI  This patient is a 53 year old female, she has multiple medical problems, this includes a history of cocaine use, history of possible multiple sclerosis, history of chronic headaches and Crohn's disease as well as IBS.  She is currently followed by a neurologist in [place] and a family doctor in [place].  She reports that recently within the last 9 days she developed a cough and until 1 week ago was having fevers as high as 101.8.  She had gone to the emergency room because of a feeling of being dizzy and having some chest discomfort.  After her evaluation she was discharged and asked to follow-up with her family doctor.  She reports that she was redirected to the neurologist after going to her family doctor but has not been able to obtain an appointment.  During this time she endorses several episodes of syncope most of which include coughing prodromal symptoms or significant drainage that makes her gag from phlegm in the back of her throat.  She denies otherwise having any nausea vomiting or diarrhea, she does not have any chest pain back pain swelling of the legs and no longer has any fevers or chills.  She continues to have cough and shortness of breath, she reports that she was treated with Levaquin after her family doctor did a virtual visit with her but after finishing the medicine she has not improved.  She still continues to smoke cigarettes, she has not had any in the last several days because of the way that she feels.  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  It is much less intense.  She does note having decreased sleep at night because of the cough as well.  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   Past Surgical History:  Procedure Laterality Date   ABDOMINAL HYSTERECTOMY     APPENDECTOMY     COLON SURGERY     KNEE ARTHROSCOPY       OB History   No obstetric history on file.      Home Medications    Prior to Admission medications   Medication Sig Start Date End Date Taking? Authorizing Provider  aspirin EC 81 MG tablet Take 81 mg by mouth daily.    [provider]  benzonatate (TESSALON) 100 MG capsule Take  1 capsule (100 mg total) by mouth every 8 (eight) hours. 10/08/19   Noemi Chapel, MD  dicyclomine (BENTYL) 20 MG tablet Take 1 tablet (20 mg total) by mouth every 6 (six) hours as needed for spasms (abdominal cramping). 12/02/18   Francine Graven, DO  hydrOXYzine (ATARAX/VISTARIL) 25 MG tablet Take 25 mg by mouth 3 (three) times daily as needed.    [provider]  ibuprofen (ADVIL,MOTRIN) 600 MG tablet Take 1 tablet (600 mg total) by mouth every 6 (six) hours as needed. 01/06/18   Evalee Jefferson, PA-C  meclizine (ANTIVERT) 25 MG tablet Take 25 mg by mouth 3 (three) times daily as needed for dizziness.    [provider]  metoCLOPramide (REGLAN) 10 MG tablet Take 1 tablet (10 mg total) by mouth every 6 (six) hours as needed for nausea (or headache).  12/02/18   Francine Graven, DO  Multiple Vitamin (MULTIVITAMIN) tablet Take 1 tablet by mouth daily.    [provider]  omeprazole (PRILOSEC) 20 MG capsule Take 20 mg by mouth as needed.    [provider]  oxyCODONE-acetaminophen (PERCOCET/ROXICET) 5-325 MG tablet Take 1 tablet by mouth every 4 (four) hours as needed. 01/06/18   Evalee Jefferson, PA-C  pantoprazole (PROTONIX) 20 MG tablet Take by mouth. 10/11/18   [provider]  predniSONE (DELTASONE) 20 MG tablet Take 2 tablets (40 mg total) by mouth daily. 10/08/19   Noemi Chapel, MD  promethazine (PHENERGAN) 25 MG tablet Take 25 mg by mouth as needed for nausea or vomiting.    [provider]    Family History History reviewed. No pertinent family history.  Social History Social History   Tobacco Use   Smoking status: Former Smoker    Packs/day: 1.00    Types: Cigarettes   Smokeless tobacco: Never Used  Substance Use Topics   Alcohol use: No    Frequency: Never   Drug use: Yes    Types: Marijuana     Allergies   Cephalosporins, Doxycycline, Hydrocodone, Keppra [levetiracetam], Lasix [furosemide], Latex, Morphine and related, and Sulfa antibiotics   Review of Systems Review of Systems  All other systems reviewed and are negative.    Physical Exam Updated Vital Signs BP (!) 146/94 (BP Location: Right Arm)    Pulse 97    Temp 98.2 F (36.8 C) (Oral)    Resp 19    Ht 1.575 m (5' 2" )    Wt 64.9 kg    SpO2 98%    BMI 26.16 kg/m   Physical Exam Vitals signs and nursing note reviewed.  Constitutional:      General: She is not in acute distress.    Appearance: She is well-developed.  HENT:     Head: Normocephalic and atraumatic.     Nose:     Comments: Nasal passages are clear with mild rhinorrhea    Mouth/Throat:     Pharynx: No oropharyngeal exudate.     Comments: The oropharynx is clear and moist, there is clear saliva but no signs of exudate asymmetry or hypertrophy,  phonation is normal Eyes:     General: No scleral icterus.       Right eye: No discharge.        Left eye: No discharge.     Conjunctiva/sclera: Conjunctivae normal.     Pupils: Pupils are equal, round, and reactive to light.  Neck:     Musculoskeletal: Normal range of motion and neck supple.     Thyroid: No thyromegaly.  Vascular: No JVD.  Cardiovascular:     Rate and Rhythm: Normal rate and regular rhythm.     Heart sounds: Normal heart sounds. No murmur. No friction rub. No gallop.   Pulmonary:     Effort: Pulmonary effort is normal. No respiratory distress.     Breath sounds: Wheezing present. No rales.     Comments: The patient is able to speak in full sentences, in fact she is able to speak for long periods of time without being disrupted by the occasional cough.  She has expiratory wheezing, there is no prolonged expiratory phase, there is no rales, there is no abnormal chest movements or accessory muscle use Abdominal:     General: Bowel sounds are normal. There is no distension.     Palpations: Abdomen is soft. There is no mass.     Tenderness: There is no abdominal tenderness.  Musculoskeletal: Normal range of motion.        General: No tenderness.  Lymphadenopathy:     Cervical: No cervical adenopathy.  Skin:    General: Skin is warm and dry.     Findings: No erythema or rash.  Neurological:     Mental Status: She is alert.     Coordination: Coordination normal.     Comments: This patient has clear speech, normal strength and coordination, no facial droop or asymmetry, seems to be at her baseline  Psychiatric:        Behavior: Behavior normal.      ED Treatments / Results  Labs (all labs ordered are listed, but only abnormal results are displayed) Labs Reviewed  SARS CORONAVIRUS 2 (TAT 6-24 HRS)    EKG EKG Interpretation  Date/Time:  Tuesday October 08 2019 11:30:17 EDT Ventricular Rate:  94 PR Interval:    QRS Duration: 87 QT Interval:  332 QTC  Calculation: 416 R Axis:   33 Text Interpretation:  Sinus rhythm Nonspecific T abnormalities, inferior leads Since last tracing rate faster Confirmed by Noemi Chapel [phone]) on 10/08/2019 11:42:00 AM   Radiology Dg Chest Port 1 View  Result Date: 10/08/2019 CLINICAL DATA:  Shortness of breath, productive cough, and congestion for 8 days EXAM: PORTABLE CHEST 1 VIEW COMPARISON:  Portable exam 1154 hours compared to 12/02/2018 FINDINGS: Normal heart size, mediastinal contours, and pulmonary vascularity. Lungs clear. No infiltrate, pleural effusion or pneumothorax. Bones demineralized. IMPRESSION: No acute abnormalities. Electronically Signed   By: Lavonia Dana M.D.   On: 10/08/2019 12:23    Procedures Procedures (including critical care time)  Medications Ordered in ED Medications  albuterol (VENTOLIN HFA) 108 (90 Base) MCG/ACT inhaler 2 puff (2 puffs Inhalation Given 10/08/19 1203)  aerochamber Z-Stat Plus/medium 1 each (1 each Other Given 10/08/19 1223)  predniSONE (DELTASONE) tablet 40 mg (40 mg Oral Given 10/08/19 1202)  benzonatate (TESSALON) capsule 200 mg (200 mg Oral Given 10/08/19 1202)     Initial Impression / Assessment and Plan / ED Course  I have reviewed the triage vital signs and the nursing notes.  Pertinent labs & imaging results that were available during my care of the patient were reviewed by me and considered in my medical decision making (see chart for details).  Clinical Course as of Oct 07 1229  Tue Oct 08, 2019  1228 I have personally viewed and interpreted the x-ray which is a anterior posterior chest x-ray, there is no signs of infiltrate, pneumothorax or abnormal mediastinum.  There is no subdiaphragmatic air in the soft tissues and bony structures  appear normal.  My impression is that this is a normal-appearing chest x-ray.   [BM]  1228 The patient will be discharged home with supportive care including albuterol, Tessalon and prednisone, she is agreeable to  the plan, there is no indications for antibiotics at this time, Covid test pending and should result within the next 24 hours   [BM]    Clinical Course User Index [BM] Noemi Chapel, MD       This patient does not appear ill, she does have clinical bronchitis with vital signs which are very reassuring, see below.  Will obtain a chest x-ray and an outpatient Covid swab.  She will be treated clinically for bronchitis, she does not have any hypoxia tachycardia hypotension or fever.  The patient is agreeable to the plan.   Test for Covid  R/O Pna with xray  Treat symptomatically for Bronchitis  Robin Freeman was evaluated in Emergency Department on 10/08/2019 for the symptoms described in the history of present illness. She was evaluated in the context of the global COVID-19 pandemic, which necessitated consideration that the patient might be at risk for infection with the SARS-CoV-2 virus that causes COVID-19. Institutional protocols and algorithms that pertain to the evaluation of patients at risk for COVID-19 are in a state of rapid change based on information released by regulatory bodies including the CDC and federal and state organizations. These policies and algorithms were followed during the patient's care in the ED.   Final Clinical Impressions(s) / ED Diagnoses   Final diagnoses:  Acute bronchitis, unspecified organism    ED Discharge Orders         Ordered    predniSONE (DELTASONE) 20 MG tablet  Daily     10/08/19 1229    benzonatate (TESSALON) 100 MG capsule  Every 8 hours     10/08/19 1229           Noemi Chapel, MD 10/08/19 1230

## 2019-10-08 NOTE — Discharge Instructions (Signed)
Your x-ray is normal No signs of pneumonia Your coronavirus test should result within the next 24 hours, it is unlikely that this will be positive but please quarantine until you get results Prednisone 40 mg daily for 5 days Tessalon, 100 mg every 8 hours as needed for coughing Albuterol, 2 puffs every 4 hours using the AeroChamber to help with shortness of breath and coughing Please continue abstinence from tobacco as this will make it more likely that you will get the pneumonia in the future.   Emergency department for severe or worsening symptoms including increasing chest pain shortness of breath or fevers

## 2020-01-27 ENCOUNTER — Ambulatory Visit: Payer: Medicaid - Out of State | Attending: Internal Medicine

## 2020-01-27 ENCOUNTER — Other Ambulatory Visit: Payer: Self-pay

## 2020-01-27 DIAGNOSIS — Z20822 Contact with and (suspected) exposure to covid-19: Secondary | ICD-10-CM

## 2020-01-28 LAB — NOVEL CORONAVIRUS, NAA: SARS-CoV-2, NAA: NOT DETECTED

## 2020-04-30 ENCOUNTER — Other Ambulatory Visit: Payer: Self-pay

## 2020-06-03 ENCOUNTER — Emergency Department (HOSPITAL_COMMUNITY): Payer: Medicaid - Out of State

## 2020-06-03 ENCOUNTER — Other Ambulatory Visit: Payer: Self-pay

## 2020-06-03 ENCOUNTER — Encounter (HOSPITAL_COMMUNITY): Payer: Self-pay

## 2020-06-03 ENCOUNTER — Emergency Department (HOSPITAL_COMMUNITY)
Admission: EM | Admit: 2020-06-03 | Discharge: 2020-06-03 | Disposition: A | Discharge status: Home / Self Care | Payer: Medicaid - Out of State | Attending: Emergency Medicine | Admitting: Emergency Medicine

## 2020-06-03 DIAGNOSIS — R079 Chest pain, unspecified: Secondary | ICD-10-CM | POA: Diagnosis not present

## 2020-06-03 DIAGNOSIS — R410 Disorientation, unspecified: Secondary | ICD-10-CM | POA: Insufficient documentation

## 2020-06-03 DIAGNOSIS — R5383 Other fatigue: Secondary | ICD-10-CM | POA: Diagnosis not present

## 2020-06-03 DIAGNOSIS — I959 Hypotension, unspecified: Secondary | ICD-10-CM | POA: Diagnosis not present

## 2020-06-03 DIAGNOSIS — R4182 Altered mental status, unspecified: Secondary | ICD-10-CM | POA: Diagnosis present

## 2020-06-03 DIAGNOSIS — F419 Anxiety disorder, unspecified: Secondary | ICD-10-CM | POA: Diagnosis not present

## 2020-06-03 DIAGNOSIS — F1210 Cannabis abuse, uncomplicated: Secondary | ICD-10-CM | POA: Diagnosis not present

## 2020-06-03 DIAGNOSIS — Z87891 Personal history of nicotine dependence: Secondary | ICD-10-CM | POA: Insufficient documentation

## 2020-06-03 DIAGNOSIS — I951 Orthostatic hypotension: Secondary | ICD-10-CM

## 2020-06-03 LAB — COMPREHENSIVE METABOLIC PANEL
ALT: 20 U/L (ref 0–44)
AST: 19 U/L (ref 15–41)
Albumin: 4 g/dL (ref 3.5–5.0)
Alkaline Phosphatase: 92 U/L (ref 38–126)
Anion gap: 9 (ref 5–15)
BUN: 24 mg/dL — ABNORMAL HIGH (ref 6–20)
CO2: 25 mmol/L (ref 22–32)
Calcium: 9 mg/dL (ref 8.9–10.3)
Chloride: 103 mmol/L (ref 98–111)
Creatinine, Ser: 0.87 mg/dL (ref 0.44–1.00)
GFR calc Af Amer: >60 mL/min (ref >60)
GFR calc non Af Amer: >60 mL/min (ref >60)
Glucose, Bld: 97 mg/dL (ref 70–99)
Potassium: 3.7 mmol/L (ref 3.5–5.1)
Sodium: 137 mmol/L (ref 135–145)
Total Bilirubin: 0.4 mg/dL (ref 0.3–1.2)
Total Protein: 7.1 g/dL (ref 6.5–8.1)

## 2020-06-03 LAB — CBC WITH DIFFERENTIAL/PLATELET
Abs Immature Granulocytes: 0.04 10*3/uL (ref 0.00–0.07)
Basophils Absolute: 0.1 10*3/uL (ref 0.0–0.1)
Basophils Relative: 1 %
Eosinophils Absolute: 0.4 10*3/uL (ref 0.0–0.5)
Eosinophils Relative: 4 %
HCT: 41.3 % (ref 36.0–46.0)
Hemoglobin: 13.7 g/dL (ref 12.0–15.0)
Immature Granulocytes: 0 %
Lymphocytes Relative: 41 %
Lymphs Abs: 4.3 10*3/uL — ABNORMAL HIGH (ref 0.7–4.0)
MCH: 30.8 pg (ref 26.0–34.0)
MCHC: 33.2 g/dL (ref 30.0–36.0)
MCV: 92.8 fL (ref 80.0–100.0)
Monocytes Absolute: 0.7 10*3/uL (ref 0.1–1.0)
Monocytes Relative: 6 %
Neutro Abs: 5.1 10*3/uL (ref 1.7–7.7)
Neutrophils Relative %: 48 %
Platelets: 240 10*3/uL (ref 150–400)
RBC: 4.45 MIL/uL (ref 3.87–5.11)
RDW: 13.5 % (ref 11.5–15.5)
WBC: 10.5 10*3/uL (ref 4.0–10.5)
nRBC: 0 % (ref 0.0–0.2)

## 2020-06-03 LAB — TROPONIN I (HIGH SENSITIVITY): Troponin I (High Sensitivity): 2 ng/L (ref <18)

## 2020-06-03 MED ORDER — NEBIVOLOL HCL 5 MG PO TABS
5.0000 mg | ORAL_TABLET | Freq: Every day | ORAL | 0 refills | Status: AC
Start: 2020-06-03 — End: ?

## 2020-06-03 MED ORDER — SODIUM CHLORIDE 0.9 % IV BOLUS
1000.0000 mL | Freq: Once | INTRAVENOUS | Status: AC
  Administered 2020-06-03: 1000 mL via INTRAVENOUS

## 2020-06-03 NOTE — ED Provider Notes (Signed)
[facility] EMERGENCY DEPARTMENT Provider Note   CSN: [id number] Arrival date & time: 06/03/20  2111     History Chief Complaint  Patient presents with  . Altered Mental Status  . Chest Pain    Robin Freeman is a 54 y.o. female.  Pt presents to the ED today with chest pain, anxiety, and confusion.  She said she did not recognize her sister. She could not remember what to do at her job.  She also has been seeing "stars" when she sit or stands up.  She has seen cardiologist for chest pain and has had a full work up.  Cholesterol was high and she was started on a new medication.  The pt denies any f/c.  Pt takes hydroxyzine for her anxiety.  She feels like it is not working.  Pt is not on a SSRI.          Past Medical History:  Diagnosis Date  . Anxiety   . Cancer ([place])   . Cocaine abuse ([place])   . Crohn disease ([place])   . Diverticulitis   . GERD (gastroesophageal reflux disease)   . Headache disorder   . IBS (irritable bowel syndrome)   . Imbalance    chronic  . 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 MS (congenital mitral stenosis)   . Multiple sclerosis ([place]) 1991  . 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 Vertigo     There are no problems to display for this patient.   Past Surgical History:  Procedure Laterality Date  . ABDOMINAL HYSTERECTOMY    . APPENDECTOMY    . COLON SURGERY    . KNEE ARTHROSCOPY       OB History   No obstetric history on file.     History reviewed. No pertinent family history.  Social History   Tobacco Use  . Smoking status: Former Smoker    Packs/day: 1.00    Types: Cigarettes  . Smokeless tobacco: Never Used  Substance Use Topics  . Alcohol use: No  . Drug use: Yes    Types: Marijuana    Home Medications Prior to Admission medications   Medication Sig Start Date End Date Taking? Authorizing Provider  ibuprofen (ADVIL,MOTRIN) 600 MG  tablet Take 1 tablet (600 mg total) by mouth every 6 (six) hours as needed. 01/06/18  Yes Idol, Almyra Free, PA-C  aspirin EC 81 MG tablet Take 81 mg by mouth daily.    [provider]  benzonatate (TESSALON) 100 MG capsule Take 1 capsule (100 mg total) by mouth every 8 (eight) hours. 10/08/19   Noemi Chapel, MD  dicyclomine (BENTYL) 20 MG tablet Take 1 tablet (20 mg total) by mouth every 6 (six) hours as needed for spasms (abdominal cramping). 12/02/18   Francine Graven, DO  hydrOXYzine (ATARAX/VISTARIL) 25 MG tablet Take 25 mg by mouth 3 (three) times daily as needed.    [provider]  meclizine (ANTIVERT) 25 MG tablet Take 25 mg by mouth 3 (three) times daily as needed for dizziness.    [provider]  metoCLOPramide (REGLAN) 10 MG tablet Take 1 tablet (10 mg total) by mouth every 6 (six) hours as needed for nausea (or headache). 12/02/18   Francine Graven, DO  Multiple Vitamin (MULTIVITAMIN) tablet Take 1 tablet by mouth daily.    [provider]  nebivolol (BYSTOLIC) 5 MG tablet Take 1 tablet (  5 mg total) by mouth daily. 06/03/20   Isla Pence, MD  omeprazole (PRILOSEC) 20 MG capsule Take 20 mg by mouth as needed.    [provider]  oxyCODONE-acetaminophen (PERCOCET/ROXICET) 5-325 MG tablet Take 1 tablet by mouth every 4 (four) hours as needed. 01/06/18   Evalee Jefferson, PA-C  pantoprazole (PROTONIX) 20 MG tablet Take by mouth. 10/11/18   [provider]  predniSONE (DELTASONE) 20 MG tablet Take 2 tablets (40 mg total) by mouth daily. 10/08/19   Noemi Chapel, MD  promethazine (PHENERGAN) 25 MG tablet Take 25 mg by mouth as needed for nausea or vomiting.    [provider]    Allergies    Cephalexin, Dicyclomine, Other, Azithromycin, Gabapentin, Iron, Morphine, Naproxen, Sumatriptan, Cephalosporins, Doxycycline, Hydrocodone, Hydrocodone-acetaminophen, Keppra [levetiracetam], Lasix [furosemide], Latex, Methylprednisolone sodium  succ, Morphine and related, Peanut oil, Sulfa antibiotics, Tomato, and Codeine  Review of Systems   Review of Systems  Constitutional: Positive for fatigue.  Cardiovascular: Positive for chest pain.  Psychiatric/Behavioral: Positive for confusion. The patient is nervous/anxious.   All other systems reviewed and are negative.   Physical Exam Updated Vital Signs BP 137/88   Pulse 70   Temp (!) 97.2 F (36.2 C) (Oral)   Resp 16   SpO2 97%   Physical Exam Vitals and nursing note reviewed.  Constitutional:      Appearance: She is well-developed.  HENT:     Head: Normocephalic and atraumatic.  Eyes:     Extraocular Movements: Extraocular movements intact.     Pupils: Pupils are equal, round, and reactive to light.  Cardiovascular:     Rate and Rhythm: Normal rate and regular rhythm.     Heart sounds: Normal heart sounds.  Pulmonary:     Effort: Pulmonary effort is normal.     Breath sounds: Normal breath sounds.  Abdominal:     General: Bowel sounds are normal.     Palpations: Abdomen is soft.  Musculoskeletal:        General: Normal range of motion.     Cervical back: Normal range of motion and neck supple.  Skin:    General: Skin is warm.     Capillary Refill: Capillary refill takes less than 2 seconds.  Neurological:     General: No focal deficit present.     Mental Status: She is alert.  Psychiatric:        Mood and Affect: Mood is anxious and depressed. Affect is tearful.     ED Results / Procedures / Treatments   Labs (all labs ordered are listed, but only abnormal results are displayed) Labs Reviewed  COMPREHENSIVE METABOLIC PANEL - Abnormal; Notable for the following components:      Result Value   BUN 24 (*)    All other components within normal limits  CBC WITH DIFFERENTIAL/PLATELET - Abnormal; Notable for the following components:   Lymphs Abs 4.3 (*)    All other components within normal limits  TROPONIN I (HIGH SENSITIVITY)  TROPONIN I (HIGH  SENSITIVITY)    EKG EKG Interpretation  Date/Time:  Wednesday June 03 2020 21:20:18 EDT Ventricular Rate:  69 PR Interval:    QRS Duration: 91 QT Interval:  371 QTC Calculation: 398 R Axis:   63 Text Interpretation: Sinus rhythm No significant change since last tracing Confirmed by Isla Pence [phone]) on 06/03/2020 11:22:12 PM   Radiology DG Chest 2 View  Result Date: 06/03/2020 CLINICAL DATA:  Change in mental status EXAM: CHEST - 2  VIEW COMPARISON:  October 08, 2019 FINDINGS: The heart size and mediastinal contours are within normal limits. Both lungs are clear. The visualized skeletal structures are unremarkable. IMPRESSION: No active cardiopulmonary disease. Electronically Signed   By: Prudencio Pair M.D.   On: 06/03/2020 22:55   CT Head Wo Contrast  Result Date: 06/03/2020 CLINICAL DATA:  Altered mental status, confusion EXAM: CT HEAD WITHOUT CONTRAST TECHNIQUE: Contiguous axial images were obtained from the base of the skull through the vertex without intravenous contrast. COMPARISON:  CT head 01/06/2018 FINDINGS: Brain: No evidence of acute infarction, hemorrhage, hydrocephalus, extra-axial collection or mass lesion/mass effect. Vascular: Atherosclerotic calcification of the carotid siphons. No hyperdense vessel. Skull: No calvarial fracture or suspicious osseous lesion. No scalp swelling or hematoma. Sinuses/Orbits: Paranasal sinuses and mastoid air cells are predominantly clear. Included orbital structures are unremarkable. Other: None. IMPRESSION: No acute intracranial findings. Electronically Signed   By: Lovena Le M.D.   On: 06/03/2020 22:44    Procedures Procedures (including critical care time)  Medications Ordered in ED Medications  sodium chloride 0.9 % bolus 1,000 mL (1,000 mLs Intravenous New Bag/Given (Non-Interop) 06/03/20 2154)    ED Course  I have reviewed the triage vital signs and the nursing notes.  Pertinent labs & imaging results that were available  during my care of the patient were reviewed by me and considered in my medical decision making (see chart for details).    MDM Rules/Calculators/A&P                         Pt just started on bystolic 10 mg.  I am going to decrease that to 5 mg as she is feeling better after fluids.  As pt has been feeling dizzy with sitting and standing, it may be too much for her.  She is to continue the cholesterol med.   Final Clinical Impression(s) / ED Diagnoses Final diagnoses:  Orthostatic hypotension    Rx / DC Orders ED Discharge Orders         Ordered    nebivolol (BYSTOLIC) 5 MG tablet  Daily     Discontinue  Reprint     06/03/20 1537           Isla Pence, MD 06/03/20 2335

## 2020-06-03 NOTE — ED Triage Notes (Addendum)
Comes in by ems from home CP since April  "ripping heart from chest"  Tingles down left arm with confusion. Recurrent episodes. Also hx of anxiety but different than usual anxiety. .   Been going to cards for same.  Needs liver test. 70 EF. HTN and high cholesterol.  Recently started on Nexletol. Wondering if she is allergic.    Currently alert and oriented

## 2020-12-03 ENCOUNTER — Other Ambulatory Visit: Payer: Medicaid - Out of State

## 2020-12-03 ENCOUNTER — Other Ambulatory Visit: Payer: Self-pay

## 2020-12-03 DIAGNOSIS — Z20822 Contact with and (suspected) exposure to covid-19: Secondary | ICD-10-CM

## 2020-12-05 LAB — NOVEL CORONAVIRUS, NAA: SARS-CoV-2, NAA: NOT DETECTED

## 2020-12-05 LAB — SPECIMEN STATUS REPORT

## 2020-12-05 LAB — SARS-COV-2, NAA 2 DAY TAT

## 2020-12-11 IMAGING — CT CT HEAD W/O CM
3 series · 16 of 47 positions shown, 19 images · non-contrast
Comparison: CT head 01/06/2018

CLINICAL DATA: Altered mental status, confusion

EXAM:
CT HEAD WITHOUT CONTRAST
TECHNIQUE: Contiguous axial images were obtained from the base of the skull
through the vertex without intravenous contrast.

[Series 2: head w o · axial · 0.43mm/px · z∈[+1646,+1771]mm · 10 of 30 slices shown, 13 images]
[im 3/30  brain]
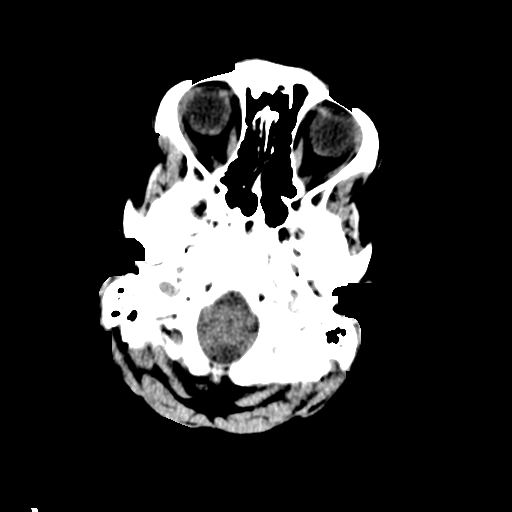
[im 3/30  bone]
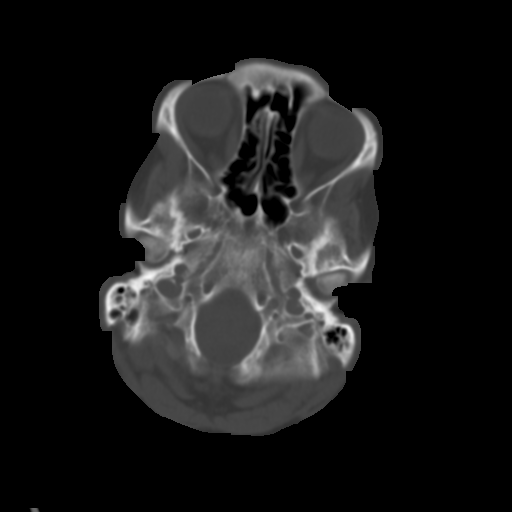
[im 6/30  brain]
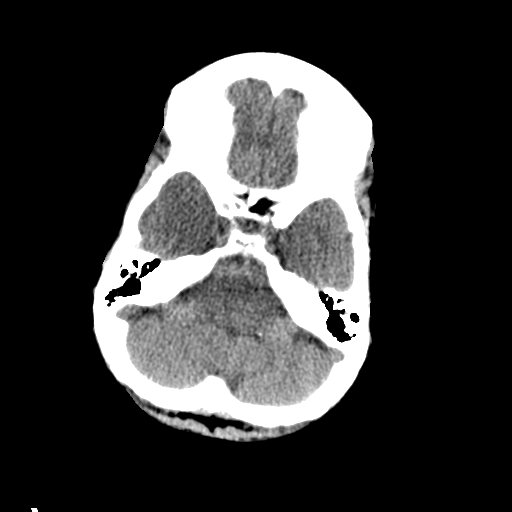
[im 9/30  brain]
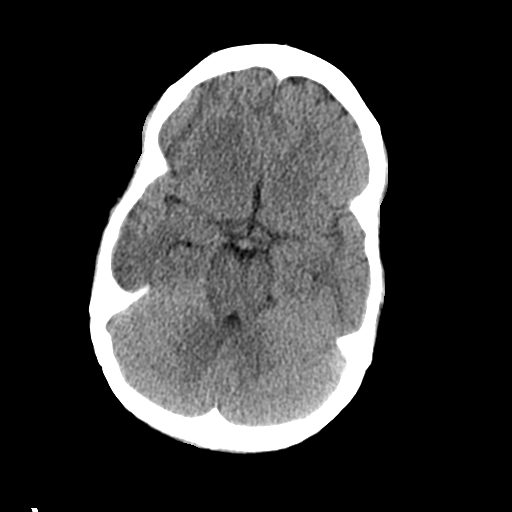
[im 11/30  brain]
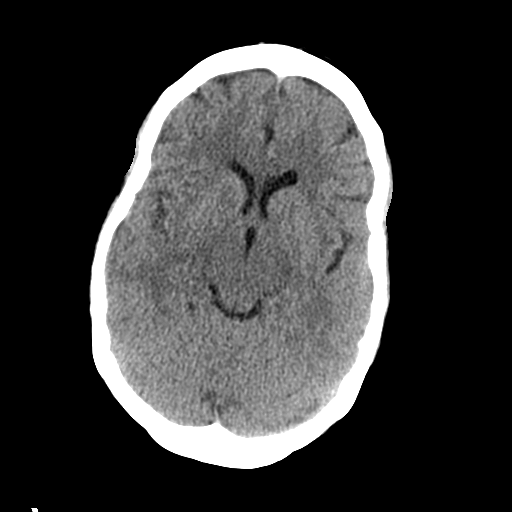
[im 14/30  brain]
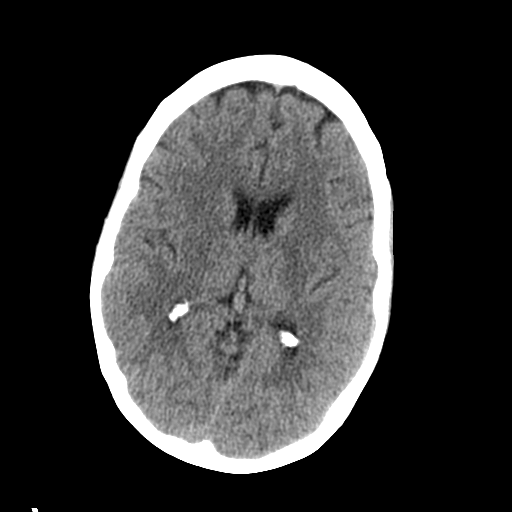
[im 14/30  bone]
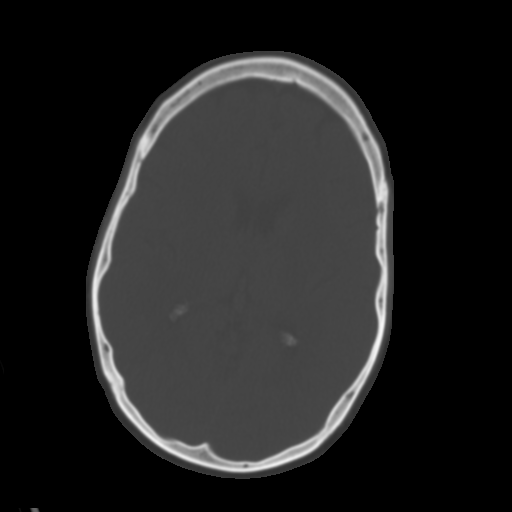
[im 17/30  brain]
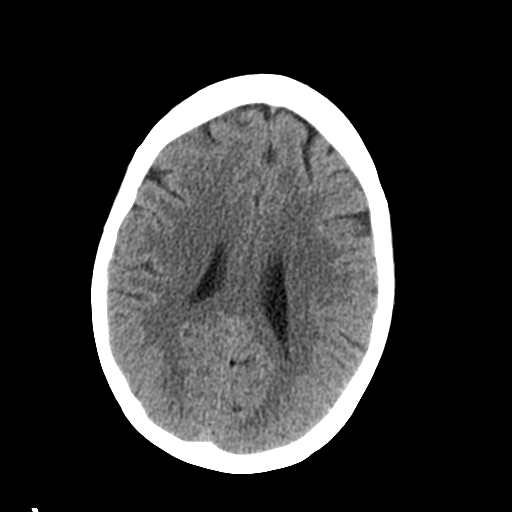
[im 20/30  brain]
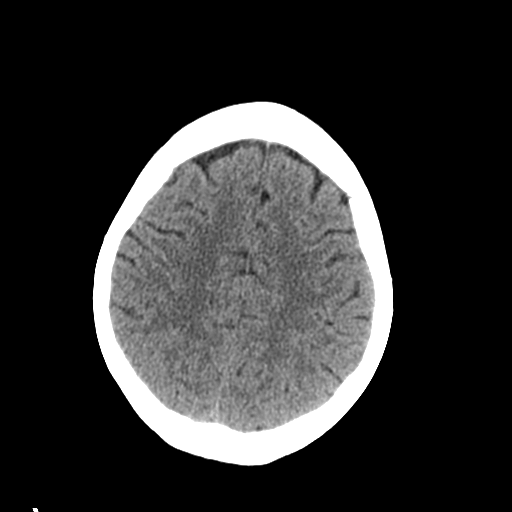
[im 23/30  brain]
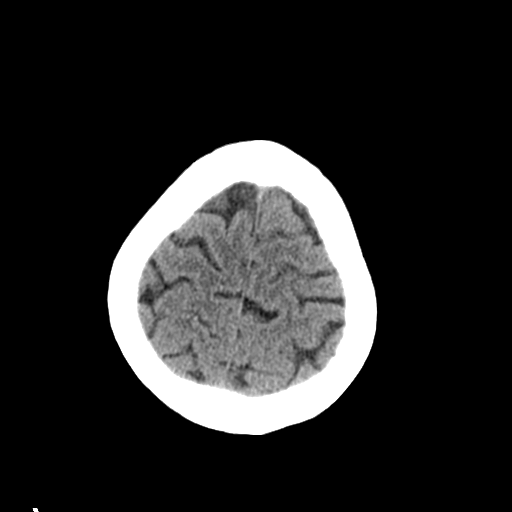
[im 25/30  brain]
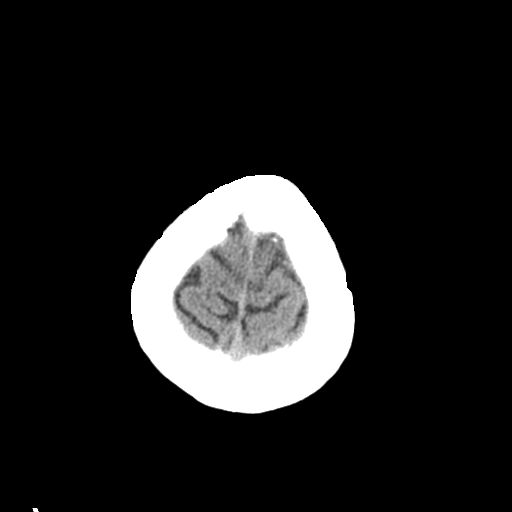
[im 25/30  bone]
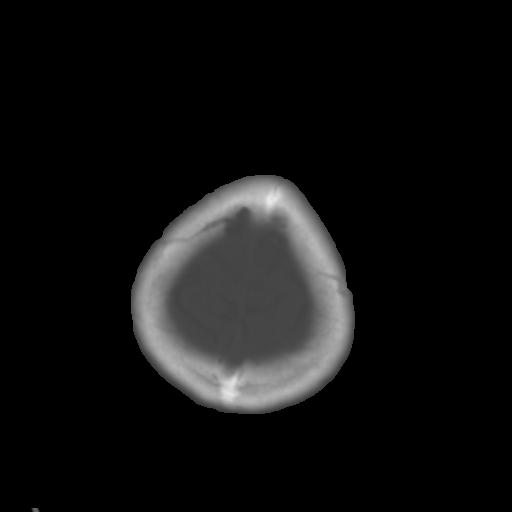
[im 28/30  brain]
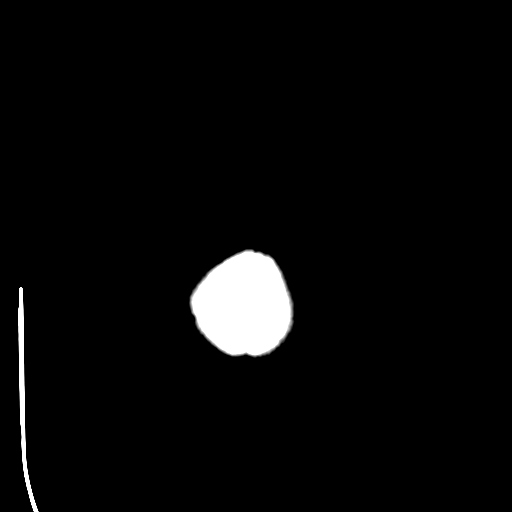

[Series 4: coronal soft · coronal · 0.30mm/px · 3 of 65 slices shown]
[im 22/65  brain]
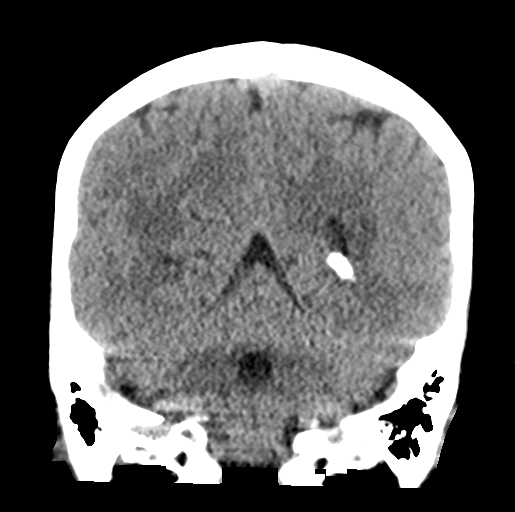
[im 29/65  brain]
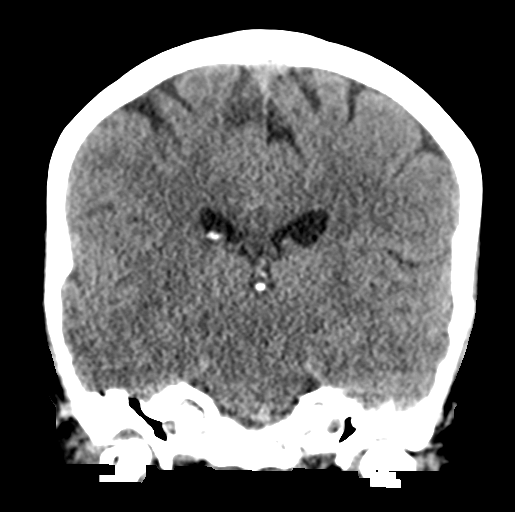
[im 36/65  brain]
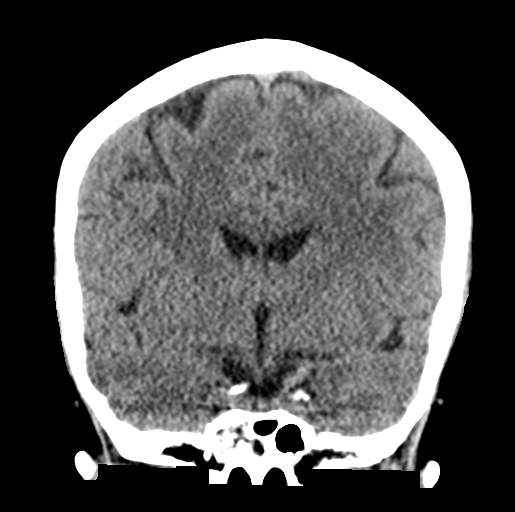

[Series 5: sagittal soft · sagittal · 0.30mm/px · 3 of 49 slices shown]
[im 17/49  brain]
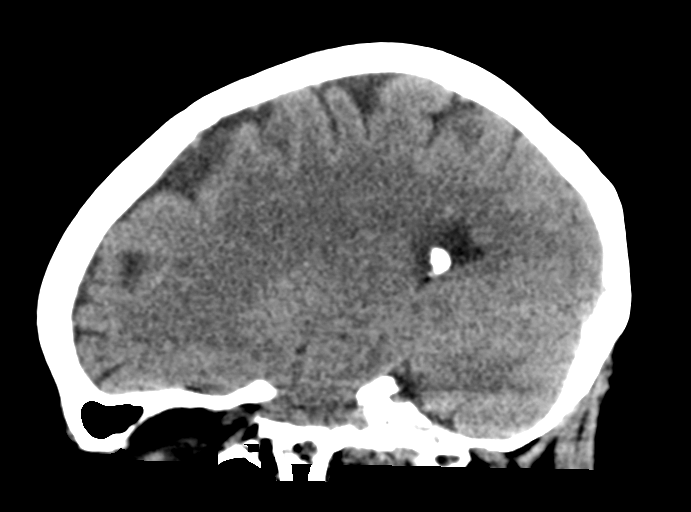
[im 25/49  brain]
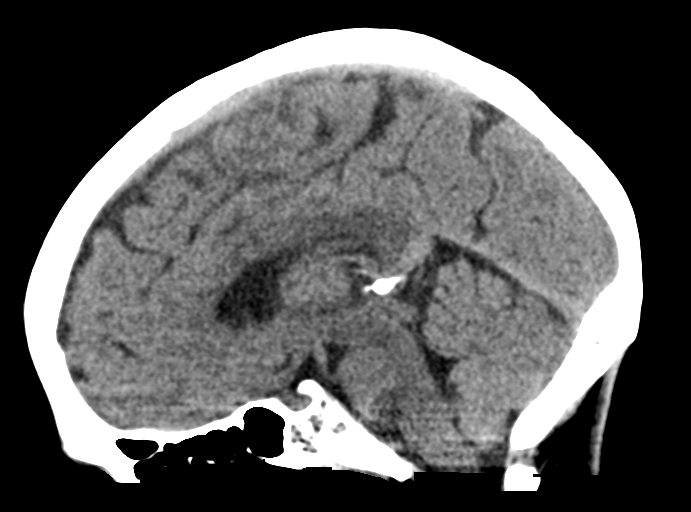
[im 33/49  brain]
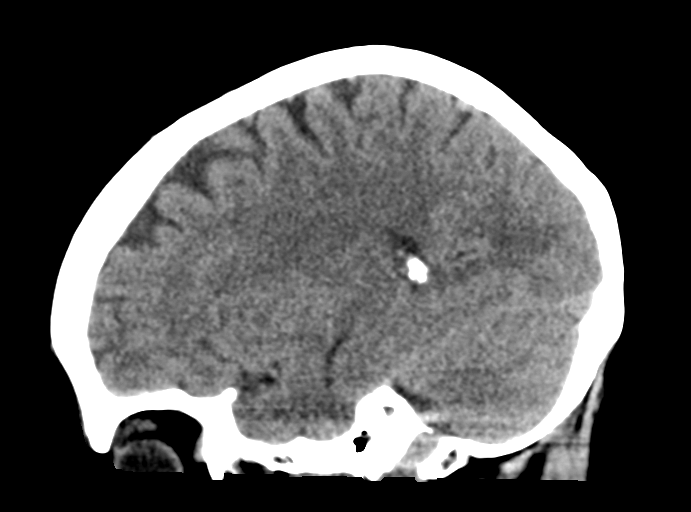

[16 of 47 positions shown; findings below may reference images not displayed]

FINDINGS: Brain: No evidence of acute infarction, hemorrhage, hydrocephalus,
extra-axial collection or mass lesion/mass effect.

Vascular: Atherosclerotic calcification of the carotid siphons. No
hyperdense vessel.

Skull: No calvarial fracture or suspicious osseous lesion. No scalp
swelling or hematoma.

Sinuses/Orbits: Paranasal sinuses and mastoid air cells are
predominantly clear. Included orbital structures are unremarkable.

Other: None.
IMPRESSION: No acute intracranial findings.

## 2021-01-19 ENCOUNTER — Other Ambulatory Visit: Payer: Medicaid - Out of State

## 2021-01-19 DIAGNOSIS — Z20822 Contact with and (suspected) exposure to covid-19: Secondary | ICD-10-CM

## 2021-01-20 LAB — SARS-COV-2, NAA 2 DAY TAT

## 2021-01-20 LAB — NOVEL CORONAVIRUS, NAA: SARS-CoV-2, NAA: NOT DETECTED
